# Patient Record
Sex: Female | Born: 1995 | Race: Black or African American | Hispanic: No | Marital: Single | Smoking: Current every day smoker
Health system: Southern US, Community
[De-identification: ages and names within clinical notes are randomized; demographics above are authoritative.]

## PROBLEM LIST (undated history)

## (undated) DIAGNOSIS — D509 Iron deficiency anemia, unspecified: Secondary | ICD-10-CM

## (undated) DIAGNOSIS — A419 Sepsis, unspecified organism: Secondary | ICD-10-CM

## (undated) DIAGNOSIS — N750 Cyst of Bartholin's gland: Secondary | ICD-10-CM

## (undated) DIAGNOSIS — IMO0001 Reserved for inherently not codable concepts without codable children: Secondary | ICD-10-CM

## (undated) DIAGNOSIS — O021 Missed abortion: Secondary | ICD-10-CM

## (undated) DIAGNOSIS — A749 Chlamydial infection, unspecified: Secondary | ICD-10-CM

## (undated) DIAGNOSIS — N921 Excessive and frequent menstruation with irregular cycle: Secondary | ICD-10-CM

## (undated) DIAGNOSIS — O009 Unspecified ectopic pregnancy without intrauterine pregnancy: Secondary | ICD-10-CM

## (undated) DIAGNOSIS — N289 Disorder of kidney and ureter, unspecified: Secondary | ICD-10-CM

## (undated) DIAGNOSIS — N898 Other specified noninflammatory disorders of vagina: Secondary | ICD-10-CM

## (undated) HISTORY — PX: ECTOPIC PREGNANCY SURGERY: SHX613

## (undated) HISTORY — DX: Missed abortion: O02.1

## (undated) HISTORY — PX: WISDOM TOOTH EXTRACTION: SHX21

## (undated) HISTORY — PX: DIAGNOSTIC LAPAROSCOPY: SUR761

## (undated) HISTORY — DX: Sepsis, unspecified organism: A41.9

## (undated) HISTORY — DX: Other specified noninflammatory disorders of vagina: N89.8

## (undated) HISTORY — DX: Chlamydial infection, unspecified: A74.9

## (undated) HISTORY — PX: DILATION AND CURETTAGE, DIAGNOSTIC / THERAPEUTIC: SUR384

## (undated) HISTORY — DX: Cyst of Bartholin's gland: N75.0

---
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
## 2015-02-10 ENCOUNTER — Encounter: Payer: 59 | Attending: Nurse Practitioner | Admitting: Dietician

## 2015-02-10 DIAGNOSIS — E669 Obesity, unspecified: Secondary | ICD-10-CM | POA: Diagnosis not present

## 2015-02-10 NOTE — Progress Notes (Signed)
Medical Nutrition Therapy: Visit start time: 1100  end time: 1200  Assessment:  Diagnosis: obesity Past medical history: no medical issues Psychosocial issues/ stress concerns: possible depression Preferred learning method:  . Auditory  Current weight: 288.6  Height: 5'6" Medications, supplements: none  Progress and evaluation: Patient reports weight loss of 30-40 lbs during high school by eating smaller portions.    She has since regained weight by increasing portions once again, and dealing with more stress.      She has a 42-year-old child, going to school, and working part-time job. Physical activity: none  Dietary Intake:  Usual eating pattern includes 2 meals and 0-1 snacks per day. Dining out frequency: 4 meals per week.  Breakfast: none Snack: none Lunch: 12:30pm usually fast food ie burger, fries Snack: none, no time at school  Supper: most days home burger, fries; pork chop with potatoes, greens, or corn, chicken. 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   Nutrition Care Education: Topics covered: weight management Basic nutrition: basic food groups, appropriate nutrient balance, appropriate meal and snack schedule, general nutrition guidelines    Weight control: behavioral changes for weight loss, controlling food portions, importance of eating at regular intervals; meal plan for 1700kcal daily   Also discussed importance of limiting excess fat and sugar in foods, and choosing high fiber foods and lean proteins   Illustrated examples of balanced meals with food models, menus  Nutritional Diagnosis:  [place]-3.3 Overweight/obesity As related to history of excess caloric intake, low activity level.  As evidenced by patient report, high BMI.  Intervention: Instruction as noted above.   Set goals to decrease meals eaten out, increase structured eating during the day, decrease fat  and sugar intake.   Patient agrees to return for follow-up.      Education Materials given:  . Food lists/ Planning A Balanced Meal . Sample meal pattern/ menus: Quick and Healthy Meal Ideas . Snacking handout . Goals/ instructions  Learner/ who was taught:  . Patient   Level of understanding: . Partial understanding; needs review/ practice  Demonstrated degree of understanding via:   Teach back Learning barriers: . None  Willingness to learn/ readiness for change: . Eager, change in progress  Monitoring and Evaluation:  Dietary intake, exercise, and body weight      follow up: 03/10/15

## 2015-02-10 NOTE — Patient Instructions (Addendum)
   Contact the [facility] pastoral care department at [phone].  Bring some lunches with you to school, use quick menus provided.  Choose low fat foods such as grilled meats, limiting processed meats like hot dogs, and use only small amounts of mayo, butter, or salad dressings.  Limit juices to 1 glass daily or less, try Healthy Balance juice, or flavored waters like [employer] or [employer].  Eat a small meal or snack in the morning sometime.

## 2015-03-10 ENCOUNTER — Ambulatory Visit: Payer: 59 | Admitting: Dietician

## 2015-05-19 ENCOUNTER — Encounter: Payer: Self-pay | Admitting: Dietician

## 2015-05-19 NOTE — Progress Notes (Signed)
Have not heard back from patient to reschedule missed appointment on 03/10/15. Sent discharge letter to MD.

## 2015-06-03 ENCOUNTER — Emergency Department
Admission: EM | Admit: 2015-06-03 | Discharge: 2015-06-03 | Disposition: A | Discharge status: Home / Self Care | Payer: 59 | Attending: Emergency Medicine | Admitting: Emergency Medicine

## 2015-06-03 DIAGNOSIS — R109 Unspecified abdominal pain: Secondary | ICD-10-CM

## 2015-06-03 DIAGNOSIS — N939 Abnormal uterine and vaginal bleeding, unspecified: Secondary | ICD-10-CM | POA: Insufficient documentation

## 2015-06-03 DIAGNOSIS — Z3202 Encounter for pregnancy test, result negative: Secondary | ICD-10-CM | POA: Diagnosis not present

## 2015-06-03 LAB — CBC WITH DIFFERENTIAL/PLATELET
Basophils Absolute: 0 10*3/uL (ref 0–0.1)
Basophils Relative: 0 %
EOS PCT: 1 %
Eosinophils Absolute: 0 10*3/uL (ref 0–0.7)
HEMATOCRIT: 32.4 % — AB (ref 35.0–47.0)
Hemoglobin: 10.3 g/dL — ABNORMAL LOW (ref 12.0–16.0)
LYMPHS ABS: 1.8 10*3/uL (ref 1.0–3.6)
LYMPHS PCT: 24 %
MCH: 26.5 pg (ref 26.0–34.0)
MCHC: 31.9 g/dL — ABNORMAL LOW (ref 32.0–36.0)
MCV: 83 fL (ref 80.0–100.0)
Monocytes Absolute: 0.6 10*3/uL (ref 0.2–0.9)
Monocytes Relative: 8 %
NEUTROS ABS: 4.9 10*3/uL (ref 1.4–6.5)
Neutrophils Relative %: 67 %
PLATELETS: 242 10*3/uL (ref 150–440)
RBC: 3.9 MIL/uL (ref 3.80–5.20)
RDW: 14.5 % (ref 11.5–14.5)
WBC: 7.3 10*3/uL (ref 3.6–11.0)

## 2015-06-03 LAB — WET PREP, GENITAL
CLUE CELLS WET PREP: NONE SEEN
Sperm: NONE SEEN
Trich, Wet Prep: NONE SEEN
Yeast Wet Prep HPF POC: NONE SEEN

## 2015-06-03 LAB — CHLAMYDIA/NGC RT PCR (ARMC ONLY)
Chlamydia Tr: NOT DETECTED
N gonorrhoeae: NOT DETECTED

## 2015-06-03 LAB — BASIC METABOLIC PANEL
Anion gap: 6 (ref 5–15)
BUN: 11 mg/dL (ref 6–20)
CO2: 28 mmol/L (ref 22–32)
Calcium: 9.3 mg/dL (ref 8.9–10.3)
Chloride: 106 mmol/L (ref 101–111)
Creatinine, Ser: 0.64 mg/dL (ref 0.44–1.00)
GFR calc Af Amer: >60 mL/min (ref >60)
Glucose, Bld: 96 mg/dL (ref 65–99)
POTASSIUM: 3.9 mmol/L (ref 3.5–5.1)
Sodium: 140 mmol/L (ref 135–145)

## 2015-06-03 LAB — POCT PREGNANCY, URINE: Preg Test, Ur: NEGATIVE

## 2015-06-03 LAB — URINALYSIS COMPLETE WITH MICROSCOPIC (ARMC ONLY)
BACTERIA UA: NONE SEEN
BILIRUBIN URINE: NEGATIVE
Glucose, UA: NEGATIVE mg/dL
Ketones, ur: NEGATIVE mg/dL
Leukocytes, UA: NEGATIVE
Nitrite: NEGATIVE
PH: 9 — AB (ref 5.0–8.0)
Protein, ur: 30 mg/dL — AB
Specific Gravity, Urine: 1.02 (ref 1.005–1.030)

## 2015-06-03 MED ORDER — NAPROXEN SODIUM 550 MG PO TABS: 550.0000 mg | ORAL_TABLET | Freq: Two times a day (BID) | ORAL | Status: DC

## 2015-06-03 MED ORDER — ONDANSETRON 4 MG PO TBDP: 4.0000 mg | ORAL_TABLET | Freq: Three times a day (TID) | ORAL | Status: DC | PRN

## 2015-06-03 NOTE — Discharge Instructions (Signed)
Abnormal Uterine Bleeding Abnormal uterine bleeding means bleeding from the vagina that is not your normal menstrual period. This can be:  Bleeding or spotting between periods.  Bleeding after sex (sexual intercourse).  Bleeding that is heavier or more than normal.  Periods that last longer than usual.  Bleeding after menopause. There are many problems that may cause this. Treatment will depend on the cause of the bleeding. Any kind of bleeding that is not normal should be reviewed by your doctor.  HOME CARE Watch your condition for any changes. These actions may lessen any discomfort you are having:  Do not use tampons or douches as told by your doctor.  Change your pads often. You should get regular pelvic exams and Pap tests. Keep all appointments for tests as told by your doctor. GET HELP IF:  You are bleeding for more than 1 week.  You feel dizzy at times. GET HELP RIGHT AWAY IF:   You pass out.  You have to change pads every 15 to 30 minutes.  You have belly pain.  You have a fever.  You become sweaty or weak.  You are passing large blood clots from the vagina.  You feel sick to your stomach (nauseous) and throw up (vomit). MAKE SURE YOU:  Understand these instructions.  Will watch your condition.  Will get help right away if you are not doing well or get worse.   This information is not intended to replace advice given to you by your health care provider. Make sure you discuss any questions you have with your health care provider.   Document Released: 03/17/2009 Document Revised: 05/25/2013 Document Reviewed: 12/17/2012 Elsevier Interactive Patient Education 2016 [employer]    Take Anaprox for abdominal cramping. Zofran for and if needed for nausea. Obtain iron supplement at drugstore and take 1 per day. Follow-up with your doctor in medicine if any continued problems.

## 2015-06-03 NOTE — ED Notes (Signed)
Pt here for abdominal cramping and small amount of vaginal bleeding. Reports having longer period than normal.  Just recently had implant removed.

## 2015-06-03 NOTE — ED Notes (Signed)
Discussed discharge instructions, prescriptions, and follow-up care with patient. No questions or concerns at this time. Pt stable at discharge.  

## 2015-06-03 NOTE — ED Provider Notes (Signed)
[facility] Emergency Department Provider Note ____________________________________________  Time seen: Approximately 11:56 AM  I have reviewed the triage vital signs and the nursing notes.   HISTORY  Chief Complaint Abdominal Cramping and Vaginal Bleeding   HPI Andrea Chang is a 19 y.o. female is here with complaint of abdominal cramping and vaginal bleeding. Patient states that she had an implant that was removed approximately 3 months ago. She has had intercourse since the removal of the implant without contraceptive protection. Patient states that she began with her period this month lasting longer than normal. She denies any fever or chills. She states that prior to her bleeding she did have a white discharge. She is unaware of her sexual partner having any problems at this time.   Past Medical History  Diagnosis Date  . Normal cardiac stress test     There are no active problems to display for this patient.   Past Surgical History  Procedure Laterality Date  . Cesarean section      Current Outpatient Rx  Name  Route  Sig  Dispense  Refill  . naproxen sodium (ANAPROX DS) 550 MG tablet   Oral   Take 1 tablet (550 mg total) by mouth 2 (two) times daily with a meal.   14 tablet   0   . ondansetron (ZOFRAN ODT) 4 MG disintegrating tablet   Oral   Take 1 tablet (4 mg total) by mouth every 8 (eight) hours as needed for nausea or vomiting.   20 tablet   0     Allergies Review of patient's allergies indicates no known allergies.  No family history on file.  Social History Social History  Substance Use Topics  . Smoking status: Never Smoker   . Smokeless tobacco: Never Used  . Alcohol Use: No    Review of Systems Constitutional: No fever/chills Eyes: No visual changes. ENT: No sore throat. Cardiovascular: Denies chest pain. Respiratory: Denies shortness of breath. Gastrointestinal: Positive abdominal cramping  No nausea, no  vomiting.  No diarrhea.  No constipation. Genitourinary: Negative for dysuria. Musculoskeletal: Negative for back pain. Skin: Negative for rash. Neurological: Negative for headaches, focal weakness or numbness.  10-point ROS otherwise negative.  ____________________________________________   PHYSICAL EXAM:  VITAL SIGNS: ED Triage Vitals  Enc Vitals Group     BP 06/03/15 1125 105/62 mmHg     Pulse Rate 06/03/15 1125 81     Resp 06/03/15 1125 18     Temp 06/03/15 1125 98.3 F (36.8 C)     Temp Source 06/03/15 1125 Oral     SpO2 06/03/15 1125 100 %     Weight 06/03/15 1125 288 lb (130.636 kg)     Height 06/03/15 1125  (1.676 m)     Head Cir --      Peak Flow --      Pain Score 06/03/15 1126 9     Pain Loc --      Pain Edu? --      Excl. in GC? --     Constitutional: Alert and oriented. Well appearing and in no acute distress. Eyes: Conjunctivae are normal. PERRL. EOMI. Head: Atraumatic. Nose: No congestion/rhinnorhea. Neck: No stridor.   Cardiovascular: Normal rate, regular rhythm. Grossly normal heart sounds.  Good peripheral circulation. Respiratory: Normal respiratory effort.  No retractions. Lungs CTAB. Gastrointestinal: Soft and nontender. No distention. Bowel sounds 4 quadrants within normal limits. Genitourinary: Pelvic exam with minimal blood in the vaginal vault. There  is minimal tenderness on bimanual exam and no cervical motion tenderness. There is no masses and adnexal areas bilaterally. GC and chlamydia culture was obtained as well as wet prep. Musculoskeletal: No lower extremity tenderness nor edema.  No joint effusions. Neurologic:  Normal speech and language. No gross focal neurologic deficits are appreciated. No gait instability. Skin:  Skin is warm, dry and intact. No rash noted. Psychiatric: Mood and affect are normal. Speech and behavior are normal.  ____________________________________________   LABS (all labs ordered are listed, but only  abnormal results are displayed)  Labs Reviewed  WET PREP, GENITAL - Abnormal; Notable for the following:    WBC, Wet Prep HPF POC FEW (*)    All other components within normal limits  CBC WITH DIFFERENTIAL/PLATELET - Abnormal; Notable for the following:    Hemoglobin 10.3 (*)    HCT 32.4 (*)    MCHC 31.9 (*)    All other components within normal limits  URINALYSIS COMPLETEWITH MICROSCOPIC ([facility] ONLY) - Abnormal; Notable for the following:    Color, Urine YELLOW (*)    APPearance CLEAR (*)    Hgb urine dipstick 1+ (*)    pH 9.0 (*)    Protein, ur 30 (*)    Squamous Epithelial / LPF 0-5 (*)    All other components within normal limits  CHLAMYDIA/NGC RT PCR ([facility] ONLY)  BASIC METABOLIC PANEL  POC URINE PREG, ED  POCT PREGNANCY, URINE  POC URINE PREG, ED    PROCEDURES  Procedure(s) performed: None  Critical Care performed: No  ____________________________________________   INITIAL IMPRESSION / ASSESSMENT AND PLAN / ED COURSE  Pertinent labs & imaging results that were available during my care of the patient were reviewed by me and considered in my medical decision making (see chart for details).  Patient's GC and chlamydia culture results was back prior to patient's discharge and patient is aware that they are negative. Patient is to obtain over-the-counter iron tablets daily. She is to follow-up with her gynecologist for further evaluation of her menstrual periods since removing her implant. She is placed on Anaprox DS twice a day for abdominal cramps and Zofran if needed for nausea. ____________________________________________   FINAL CLINICAL IMPRESSION(S) / ED DIAGNOSES  Final diagnoses:  Vaginal bleeding problems  Abdominal cramps      Tommi RumpsRhonda L Fountain Derusha, PA-C 06/03/15 1828  Darien Ramusavid W Kaminski, MD 06/06/15 [phone]
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
## 2015-09-28 ENCOUNTER — Encounter: Payer: Self-pay | Admitting: Emergency Medicine

## 2015-09-28 ENCOUNTER — Emergency Department
Admission: EM | Admit: 2015-09-28 | Discharge: 2015-09-28 | Disposition: A | Discharge status: Home / Self Care | Payer: 59 | Attending: Emergency Medicine | Admitting: Emergency Medicine

## 2015-09-28 ENCOUNTER — Emergency Department: Payer: 59

## 2015-09-28 DIAGNOSIS — Z3A01 Less than 8 weeks gestation of pregnancy: Secondary | ICD-10-CM | POA: Diagnosis not present

## 2015-09-28 DIAGNOSIS — Z3490 Encounter for supervision of normal pregnancy, unspecified, unspecified trimester: Secondary | ICD-10-CM

## 2015-09-28 DIAGNOSIS — R1030 Lower abdominal pain, unspecified: Secondary | ICD-10-CM | POA: Diagnosis present

## 2015-09-28 DIAGNOSIS — O2341 Unspecified infection of urinary tract in pregnancy, first trimester: Secondary | ICD-10-CM | POA: Diagnosis not present

## 2015-09-28 DIAGNOSIS — Z79899 Other long term (current) drug therapy: Secondary | ICD-10-CM | POA: Diagnosis not present

## 2015-09-28 DIAGNOSIS — N390 Urinary tract infection, site not specified: Secondary | ICD-10-CM

## 2015-09-28 HISTORY — DX: Unspecified ectopic pregnancy without intrauterine pregnancy: O00.90

## 2015-09-28 LAB — URINALYSIS COMPLETE WITH MICROSCOPIC (ARMC ONLY)
Bilirubin Urine: NEGATIVE
Glucose, UA: NEGATIVE mg/dL
HGB URINE DIPSTICK: NEGATIVE
Nitrite: POSITIVE — AB
PH: 7 (ref 5.0–8.0)
PROTEIN: NEGATIVE mg/dL
Specific Gravity, Urine: 1.015 (ref 1.005–1.030)

## 2015-09-28 LAB — HCG, QUANTITATIVE, PREGNANCY: hCG, Beta Chain, Quant, S: 73661 m[IU]/mL — ABNORMAL HIGH (ref <5)

## 2015-09-28 LAB — COMPREHENSIVE METABOLIC PANEL
ALT: 11 U/L — ABNORMAL LOW (ref 14–54)
AST: 13 U/L — AB (ref 15–41)
Albumin: 4.1 g/dL (ref 3.5–5.0)
Alkaline Phosphatase: 41 U/L (ref 38–126)
Anion gap: 9 (ref 5–15)
BUN: 11 mg/dL (ref 6–20)
CHLORIDE: 104 mmol/L (ref 101–111)
CO2: 23 mmol/L (ref 22–32)
Calcium: 9.2 mg/dL (ref 8.9–10.3)
Creatinine, Ser: 0.56 mg/dL (ref 0.44–1.00)
GFR calc non Af Amer: >60 mL/min (ref >60)
Glucose, Bld: 89 mg/dL (ref 65–99)
POTASSIUM: 3.4 mmol/L — AB (ref 3.5–5.1)
SODIUM: 136 mmol/L (ref 135–145)
Total Bilirubin: 0.5 mg/dL (ref 0.3–1.2)
Total Protein: 8 g/dL (ref 6.5–8.1)

## 2015-09-28 LAB — CBC
HEMATOCRIT: 34.7 % — AB (ref 35.0–47.0)
Hemoglobin: 10.9 g/dL — ABNORMAL LOW (ref 12.0–16.0)
MCH: 23.6 pg — ABNORMAL LOW (ref 26.0–34.0)
MCHC: 31.3 g/dL — ABNORMAL LOW (ref 32.0–36.0)
MCV: 75.5 fL — AB (ref 80.0–100.0)
Platelets: 264 10*3/uL (ref 150–440)
RBC: 4.6 MIL/uL (ref 3.80–5.20)
RDW: 16.3 % — ABNORMAL HIGH (ref 11.5–14.5)
WBC: 9.7 10*3/uL (ref 3.6–11.0)

## 2015-09-28 LAB — CHLAMYDIA/NGC RT PCR (ARMC ONLY)
Chlamydia Tr: NOT DETECTED
N gonorrhoeae: NOT DETECTED

## 2015-09-28 LAB — POCT PREGNANCY, URINE: Preg Test, Ur: POSITIVE — AB

## 2015-09-28 LAB — WET PREP, GENITAL
Sperm: NONE SEEN
YEAST WET PREP: NONE SEEN

## 2015-09-28 MED ORDER — ONDANSETRON 4 MG PO TBDP
4.0000 mg | ORAL_TABLET | Freq: Once | ORAL | Status: AC
  Administered 2015-09-28: 4 mg via ORAL

## 2015-09-28 MED ORDER — ONDANSETRON 4 MG PO TBDP
ORAL_TABLET | ORAL | Status: AC
  Filled 2015-09-28: qty 1

## 2015-09-28 MED ORDER — NITROFURANTOIN MONOHYD MACRO 100 MG PO CAPS: 100.0000 mg | ORAL_CAPSULE | Freq: Two times a day (BID) | ORAL | Status: AC

## 2015-09-28 NOTE — ED Notes (Signed)
Called in waiting room no answer. 

## 2015-09-28 NOTE — Discharge Instructions (Signed)
Urinary Tract Infection A urinary tract infection (UTI) can occur any place along the urinary tract. The tract includes the kidneys, ureters, bladder, and urethra. A type of germ called bacteria often causes a UTI. UTIs are often helped with antibiotic medicine.  HOME CARE   If given, take antibiotics as told by your doctor. Finish them even if you start to feel better.  Drink enough fluids to keep your pee (urine) clear or pale yellow.  Avoid tea, drinks with caffeine, and bubbly (carbonated) drinks.  Pee often. Avoid holding your pee in for a long time.  Pee before and after having sex (intercourse).  Wipe from front to back after you poop (bowel movement) if you are a woman. Use each tissue only once. GET HELP RIGHT AWAY IF:   You have back pain.  You have lower belly (abdominal) pain.  You have chills.  You feel sick to your stomach (nauseous).  You throw up (vomit).  Your burning or discomfort with peeing does not go away.  You have a fever.  Your symptoms are not better in 3 days. MAKE SURE YOU:   Understand these instructions.  Will watch your condition.  Will get help right away if you are not doing well or get worse.   This information is not intended to replace advice given to you by your health care provider. Make sure you discuss any questions you have with your health care provider.   Document Released: 11/06/2007 Document Revised: 06/10/2014 Document Reviewed: 12/19/2011 Elsevier Interactive Patient Education 2016 Elsevier Inc.  Pregnancy and Urinary Tract Infection A urinary tract infection (UTI) is a bacterial infection of the urinary tract. Infection of the urinary tract can include the ureters, kidneys (pyelonephritis), bladder (cystitis), and urethra (urethritis). All pregnant women should be screened for bacteria in the urinary tract. Identifying and treating a UTI will decrease the risk of preterm labor and developing more serious infections in both  the mother and baby. CAUSES Bacteria germs cause almost all UTIs.  RISK FACTORS Many factors can increase your chances of getting a UTI during pregnancy. These include:  Having a short urethra.  Poor toilet and hygiene habits.  Sexual intercourse.  Blockage of urine along the urinary tract.  Problems with the pelvic muscles or nerves.  Diabetes.  Obesity.  Bladder problems after having several children.  Previous history of UTI. SIGNS AND SYMPTOMS   Pain, burning, or a stinging feeling when urinating.  Suddenly feeling the need to urinate right away (urgency).  Loss of bladder control (urinary incontinence).  Frequent urination, more than is common with pregnancy.  Lower abdominal or back discomfort.  Cloudy urine.  Blood in the urine (hematuria).  Fever. When the kidneys are infected, the symptoms may be:  Back pain.  Flank pain on the right side more so than the left.  Fever.  Chills.  Nausea.  Vomiting. DIAGNOSIS  A urinary tract infection is usually diagnosed through urine tests. Additional tests and procedures are sometimes done. These may include:  Ultrasound exam of the kidneys, ureters, bladder, and urethra.  Looking in the bladder with a lighted tube (cystoscopy). TREATMENT Typically, UTIs can be treated with antibiotic medicines.  HOME CARE INSTRUCTIONS   Only take over-the-counter or prescription medicines as directed by your health care provider. If you were prescribed antibiotics, take them as directed. Finish them even if you start to feel better.  Drink enough fluids to keep your urine clear or pale yellow.  Do not have sexual  intercourse until the infection is gone and your health care provider says it is okay.  Make sure you are tested for UTIs throughout your pregnancy. These infections often come back. Preventing a UTI in the Future  Practice good toilet habits. Always wipe from front to back. Use the tissue only once.  Do  not hold your urine. Empty your bladder as soon as possible when the urge comes.  Do not douche or use deodorant sprays.  Wash with soap and warm water around the genital area and the anus.  Empty your bladder before and after sexual intercourse.  Wear underwear with a cotton crotch.  Avoid caffeine and carbonated drinks. They can irritate the bladder.  Drink cranberry juice or take cranberry pills. This may decrease the risk of getting a UTI.  Do not drink alcohol.  Keep all your appointments and tests as scheduled. SEEK MEDICAL CARE IF:   Your symptoms get worse.  You are still having fevers 2 or more days after treatment begins.  You have a rash.  You feel that you are having problems with medicines prescribed.  You have abnormal vaginal discharge. SEEK IMMEDIATE MEDICAL CARE IF:   You have back or flank pain.  You have chills.  You have blood in your urine.  You have nausea and vomiting.  You have contractions of your uterus.  You have a gush of fluid from the vagina. MAKE SURE YOU:  Understand these instructions.   Will watch your condition.   Will get help right away if you are not doing well or get worse.    This information is not intended to replace advice given to you by your health care provider. Make sure you discuss any questions you have with your health care provider.   Document Released: 09/14/2010 Document Revised: 03/10/2013 Document Reviewed: 12/17/2012 Elsevier Interactive Patient Education 2016 [employer].  Prenatal Care WHAT IS PRENATAL CARE?  Prenatal care is the process of caring for a pregnant woman before she gives birth. Prenatal care makes sure that she and her baby remain as healthy as possible throughout pregnancy. Prenatal care may be provided by a midwife, family practice health care provider, or a childbirth and pregnancy specialist (obstetrician). Prenatal care may include physical examinations, testing, treatments, and  education on nutrition, lifestyle, and social support services. WHY IS PRENATAL CARE SO IMPORTANT?  Early and consistent prenatal care increases the chance that you and your baby will remain healthy throughout your pregnancy. This type of care also decreases a baby's risk of being born too early (prematurely), or being born smaller than expected (small for gestational age). Any underlying medical conditions you may have that could pose a risk during your pregnancy are discussed during prenatal care visits. You will also be monitored regularly for any new conditions that may arise during your pregnancy so they can be treated quickly and effectively. WHAT HAPPENS DURING PRENATAL CARE VISITS? Prenatal care visits may include the following: Discussion Tell your health care provider about any new signs or symptoms you have experienced since your last visit. These might include:  Nausea or vomiting.  Increased or decreased level of energy.  Difficulty sleeping.  Back or leg pain.  Weight changes.  Frequent urination.  Shortness of breath with physical activity.  Changes in your skin, such as the development of a rash or itchiness.  Vaginal discharge or bleeding.  Feelings of excitement or nervousness.  Changes in your baby's movements. You may want to write down any  questions or topics you want to discuss with your health care provider and bring them with you to your appointment. Examination During your first prenatal care visit, you will likely have a complete physical exam. Your health care provider will often examine your vagina, cervix, and the position of your uterus, as well as check your heart, lungs, and other body systems. As your pregnancy progresses, your health care provider will measure the size of your uterus and your baby's position inside your uterus. He or she may also examine you for early signs of labor. Your prenatal visits may also include checking your blood pressure  and, after about 10-12 weeks of pregnancy, listening to your baby's heartbeat. Testing Regular testing often includes:  Urinalysis. This checks your urine for glucose, protein, or signs of infection.  Blood count. This checks the levels of white and red blood cells in your body.  Tests for sexually transmitted infections (STIs). Testing for STIs at the beginning of pregnancy is routinely done and is required in many states.  Antibody testing. You will be checked to see if you are immune to certain illnesses, such as rubella, that can affect a developing fetus.  Glucose screen. Around 24-28 weeks of pregnancy, your blood glucose level will be checked for signs of gestational diabetes. Follow-up tests may be recommended.  Group B strep. This is a bacteria that is commonly found inside a woman's vagina. This test will inform your health care provider if you need an antibiotic to reduce the amount of this bacteria in your body prior to labor and childbirth.  Ultrasound. Many pregnant women undergo an ultrasound screening around 18-20 weeks of pregnancy to evaluate the health of the fetus and check for any developmental abnormalities.  HIV (human immunodeficiency virus) testing. Early in your pregnancy, you will be screened for HIV. If you are at high risk for HIV, this test may be repeated during your third trimester of pregnancy. You may be offered other testing based on your age, personal or family medical history, or other factors.  HOW OFTEN SHOULD I PLAN TO SEE MY HEALTH CARE PROVIDER FOR PRENATAL CARE? Your prenatal care check-up schedule depends on any medical conditions you have before, or develop during, your pregnancy. If you do not have any underlying medical conditions, you will likely be seen for checkups:  Monthly, during the first 6 months of pregnancy.  Twice a month during months 7 and 8 of pregnancy.  Weekly starting in the 9th month of pregnancy and until delivery. If you  develop signs of early labor or other concerning signs or symptoms, you may need to see your health care provider more often. Ask your health care provider what prenatal care schedule is best for you. WHAT CAN I DO TO KEEP MYSELF AND MY BABY AS HEALTHY AS POSSIBLE DURING MY PREGNANCY?  Take a prenatal vitamin containing 400 micrograms (0.4 mg) of folic acid every day. Your health care provider may also ask you to take additional vitamins such as iodine, vitamin D, iron, copper, and zinc.  Take 1500-2000 mg of calcium daily starting at your 20th week of pregnancy until you deliver your baby.  Make sure you are up to date on your vaccinations. Unless directed otherwise by your health care provider:  You should receive a tetanus, diphtheria, and pertussis (Tdap) vaccination between the 27th and 36th week of your pregnancy, regardless of when your last Tdap immunization occurred. This helps protect your baby from whooping cough (pertussis) after he  or she is born.  You should receive an annual inactivated influenza vaccine (IIV) to help protect you and your baby from influenza. This can be done at any point during your pregnancy.  Eat a well-rounded diet that includes:  Fresh fruits and vegetables.  Lean proteins.  Calcium-rich foods such as milk, yogurt, hard cheeses, and dark, leafy greens.  Whole grain breads.  Do noteat seafood high in mercury, including:  Swordfish.  Tilefish.  Shark.  King mackerel.  More than 6 oz tuna per week.  Do not eat:  Raw or undercooked meats or eggs.  Unpasteurized foods, such as soft cheeses (brie, blue, or feta), juices, and milks.  Lunch meats.  Hot dogs that have not been heated until they are steaming.  Drink enough water to keep your urine clear or pale yellow. For many women, this may be 10 or more 8 oz glasses of water each day. Keeping yourself hydrated helps deliver nutrients to your baby and may prevent the start of pre-term  uterine contractions.  Do not use any tobacco products including cigarettes, chewing tobacco, or electronic cigarettes. If you need help quitting, ask your health care provider.  Do not drink beverages containing alcohol. No safe level of alcohol consumption during pregnancy has been determined.  Do not use any illegal drugs. These can harm your developing baby or cause a miscarriage.  Ask your health care provider or pharmacist before taking any prescription or over-the-counter medicines, herbs, or supplements.  Limit your caffeine intake to no more than 200 mg per day.  Exercise. Unless told otherwise by your health care provider, try to get 30 minutes of moderate exercise most days of the week. Do not  do high-impact activities, contact sports, or activities with a high risk of falling, such as horseback riding or downhill skiing.  Get plenty of rest.  Avoid anything that raises your body temperature, such as hot tubs and saunas.  If you own a cat, do not empty its litter box. Bacteria contained in cat feces can cause an infection called toxoplasmosis. This can result in serious harm to the fetus.  Stay away from chemicals such as insecticides, lead, mercury, and cleaning or paint products that contain solvents.  Do not have any X-rays taken unless medically necessary.  Take a childbirth and breastfeeding preparation class. Ask your health care provider if you need a referral or recommendation.   This information is not intended to replace advice given to you by your health care provider. Make sure you discuss any questions you have with your health care provider.   Document Released: 05/23/2003 Document Revised: 06/10/2014 Document Reviewed: 08/04/2013 Elsevier Interactive Patient Education [employer] Elsevier Inc.

## 2015-09-28 NOTE — ED Notes (Signed)
Patient presents to the ED with pelvic cramping.  Patient states, "it feels like it normally does before my period but I don't usually have cramps for this long and my period hasn't started."  Patient states she may be pregnant but has not taken a home pregnancy test.  Patient reports nausea and irritability.

## 2015-09-28 NOTE — ED Provider Notes (Signed)
[facility] Emergency Department Provider Note  Time seen: 2:45 PM  I have reviewed the triage vital signs and the nursing notes.   HISTORY  Chief Complaint Pelvic Pain    HPI Drue Flirtameka D Timson is a 20 y.o. female with no past medical history who presents the emergency department for lower abdominal pain, and being late on her period. According to the patient for the past 2 weeks or so she has been having some lower abdominal discomfort and cramping. It felt like her period was about to start but he never started. Patient states she is also been having a mild amount of vaginal discharge. Denies any dysuria or hematuria. Denies any nausea, vomiting. Patient does report increased irritability and issues with her mood. She thought maybe she could be pregnant but has not taken appearing to test at home. Describes her lower abdominal discomfort is mild, cramping sensation with a mild amount of white vaginal discharge.     Past Medical History  Diagnosis Date  . Normal cardiac stress test   . Ectopic pregnancy     There are no active problems to display for this patient.   Past Surgical History  Procedure Laterality Date  . Cesarean section    . Ectopic pregnancy surgery      Current Outpatient Rx  Name  Route  Sig  Dispense  Refill  . naproxen sodium (ANAPROX DS) 550 MG tablet   Oral   Take 1 tablet (550 mg total) by mouth 2 (two) times daily with a meal.   14 tablet   0   . ondansetron (ZOFRAN ODT) 4 MG disintegrating tablet   Oral   Take 1 tablet (4 mg total) by mouth every 8 (eight) hours as needed for nausea or vomiting.   20 tablet   0     Allergies Review of patient's allergies indicates no known allergies.  No family history on file.  Social History Social History  Substance Use Topics  . Smoking status: Never Smoker   . Smokeless tobacco: Never Used  . Alcohol Use: No    Review of Systems Constitutional: Negative for  fever. Cardiovascular: Negative for chest pain. Respiratory: Negative for shortness of breath. Gastrointestinal: Mild lower abdominal pain. Negative for nausea, vomiting, diarrhea Genitourinary: Negative for dysuria. Positive for vaginal discharge. Neurological: Negative for headache 10-point ROS otherwise negative.  ____________________________________________   PHYSICAL EXAM:  VITAL SIGNS: ED Triage Vitals  Enc Vitals Group     BP 09/28/15 1143 119/70 mmHg     Pulse Rate 09/28/15 1140 75     Resp 09/28/15 1140 16     Temp 09/28/15 1140 99.1 F (37.3 C)     Temp Source 09/28/15 1140 Oral     SpO2 09/28/15 1140 99 %     Weight 09/28/15 1140 277 lb (125.646 kg)     Height 09/28/15 1140 5\' 6"  (1.676 m)     Head Cir --      Peak Flow --      Pain Score 09/28/15 1141 7     Pain Loc --      Pain Edu? --      Excl. in GC? --     Constitutional: Alert and oriented. Well appearing and in no distress. Eyes: Normal exam ENT   Head: Normocephalic and atraumatic.   Mouth/Throat: Mucous membranes are moist. Cardiovascular: Normal rate, regular rhythm. No murmur Respiratory: Normal respiratory effort without tachypnea nor retractions. Breath sounds are clear Gastrointestinal:  Soft, mild suprapubic tenderness palpation. No rebound or guarding. No distention. No CVA tenderness Musculoskeletal: Nontender with normal range of motion in all extremities.  Neurologic:  Normal speech and language. No gross focal neurologic deficits Skin:  Skin is warm, dry and intact.  Psychiatric: Mood and affect are normal.   ____________________________________________     RADIOLOGY  Ultrasound consistent with 6 week 3 day IUP.  ____________________________________________    INITIAL IMPRESSION / ASSESSMENT AND PLAN / ED COURSE  Pertinent labs & imaging results that were available during my care of the patient were reviewed by me and considered in my medical decision making (see chart  for details).  Patient presents the emergency department lower abdominal cramping and mild vaginal discharge. Patient's workup shows a urinary tract infection, she is also pregnant with an ultrasound showing 6 week 3 day IUP without abnormality. Patient mild suprapubic tenderness to palpation. Given her vaginal discharge will perform a pelvic examination. We will place the patient on Macrobid for urinary tract infection.  Pelvic exam shows moderate amount of white/clear discharge.  I sent a wet prep as well as GC/Chlamydia. Patient states she needs to go pick up her child and cannot wait for the results. We'll discharge the patient at this time. She states she'll follow up with OB/GYN in the next 2 weeks.  _________________________________________   FINAL CLINICAL IMPRESSION(S) / ED DIAGNOSES  Pelvic cramping First trimester pregnancy UTI   Minna Antis, MD 09/28/15 1515

## 2015-10-03 ENCOUNTER — Emergency Department
Admission: EM | Admit: 2015-10-03 | Discharge: 2015-10-03 | Disposition: A | Discharge status: Home / Self Care | Payer: 59 | Attending: Emergency Medicine | Admitting: Emergency Medicine

## 2015-10-03 ENCOUNTER — Encounter: Payer: Self-pay | Admitting: *Deleted

## 2015-10-03 ENCOUNTER — Emergency Department: Payer: 59

## 2015-10-03 DIAGNOSIS — Y939 Activity, unspecified: Secondary | ICD-10-CM | POA: Diagnosis not present

## 2015-10-03 DIAGNOSIS — S00531A Contusion of lip, initial encounter: Secondary | ICD-10-CM | POA: Diagnosis not present

## 2015-10-03 DIAGNOSIS — A599 Trichomoniasis, unspecified: Secondary | ICD-10-CM | POA: Diagnosis not present

## 2015-10-03 DIAGNOSIS — R6884 Jaw pain: Secondary | ICD-10-CM | POA: Diagnosis not present

## 2015-10-03 DIAGNOSIS — Z3A01 Less than 8 weeks gestation of pregnancy: Secondary | ICD-10-CM | POA: Diagnosis not present

## 2015-10-03 DIAGNOSIS — R1084 Generalized abdominal pain: Secondary | ICD-10-CM | POA: Diagnosis not present

## 2015-10-03 DIAGNOSIS — R109 Unspecified abdominal pain: Secondary | ICD-10-CM

## 2015-10-03 DIAGNOSIS — Y999 Unspecified external cause status: Secondary | ICD-10-CM | POA: Diagnosis not present

## 2015-10-03 DIAGNOSIS — Y929 Unspecified place or not applicable: Secondary | ICD-10-CM | POA: Diagnosis not present

## 2015-10-03 DIAGNOSIS — Z791 Long term (current) use of non-steroidal anti-inflammatories (NSAID): Secondary | ICD-10-CM | POA: Diagnosis not present

## 2015-10-03 DIAGNOSIS — N760 Acute vaginitis: Secondary | ICD-10-CM

## 2015-10-03 DIAGNOSIS — O23591 Infection of other part of genital tract in pregnancy, first trimester: Secondary | ICD-10-CM | POA: Insufficient documentation

## 2015-10-03 DIAGNOSIS — R102 Pelvic and perineal pain: Secondary | ICD-10-CM

## 2015-10-03 DIAGNOSIS — B9689 Other specified bacterial agents as the cause of diseases classified elsewhere: Secondary | ICD-10-CM

## 2015-10-03 DIAGNOSIS — S0993XA Unspecified injury of face, initial encounter: Secondary | ICD-10-CM | POA: Diagnosis present

## 2015-10-03 DIAGNOSIS — O26899 Other specified pregnancy related conditions, unspecified trimester: Secondary | ICD-10-CM

## 2015-10-03 DIAGNOSIS — Z79899 Other long term (current) drug therapy: Secondary | ICD-10-CM | POA: Insufficient documentation

## 2015-10-03 MED ORDER — METRONIDAZOLE 500 MG PO TABS: 500.0000 mg | ORAL_TABLET | Freq: Two times a day (BID) | ORAL | Status: DC

## 2015-10-03 NOTE — ED Notes (Signed)
Pt verbalized she does not want to wait any longer macular degeneration made aware. Pt verbalized she wants prescriptions and wants to leave against medical advice.
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
## 2015-10-03 NOTE — Discharge Instructions (Signed)
As we discussed, although we have a low suspicion that you sustained a fracture to your jaw or an injury to your fetus, we cannot be sure without obtaining imaging.  However, you explained that you are tired of waiting, so we will discharge you with your necessary prescriptions.  We recommend that you read through the included information, take the full course of medication as prescribed, and follow up with your regular doctor.  Return to the emergency department if you develop new or worsening symptoms that concern you.   Trichomoniasis Trichomoniasis is an infection caused by an organism called Trichomonas. The infection can affect both women and men. In women, the outer female genitalia and the vagina are affected. In men, the penis is mainly affected, but the prostate and other reproductive organs can also be involved. Trichomoniasis is a sexually transmitted infection (STI) and is most often passed to another person through sexual contact.  RISK FACTORS  Having unprotected sexual intercourse.  Having sexual intercourse with an infected partner. SIGNS AND SYMPTOMS  Symptoms of trichomoniasis in women include:  Abnormal gray-green frothy vaginal discharge.  Itching and irritation of the vagina.  Itching and irritation of the area outside the vagina. Symptoms of trichomoniasis in men include:   Penile discharge with or without pain.  Pain during urination. This results from inflammation of the urethra. DIAGNOSIS  Trichomoniasis may be found during a Pap test or physical exam. Your health care provider may use one of the following methods to help diagnose this infection:  Testing the pH of the vagina with a test tape.  Using a vaginal swab test that checks for the Trichomonas organism. A test is available that provides results within a few minutes.  Examining a urine sample.  Testing vaginal secretions. Your health care provider may test you for other STIs, including HIV. TREATMENT    You may be given medicine to fight the infection. Women should inform their health care provider if they could be or are pregnant. Some medicines used to treat the infection should not be taken during pregnancy.  Your health care provider may recommend over-the-counter medicines or creams to decrease itching or irritation.  Your sexual partner will need to be treated if infected.  Your health care provider may test you for infection again 3 months after treatment. HOME CARE INSTRUCTIONS   Take medicines only as directed by your health care provider.  Take over-the-counter medicine for itching or irritation as directed by your health care provider.  Do not have sexual intercourse while you have the infection.  Women should not douche or wear tampons while they have the infection.  Discuss your infection with your partner. Your partner may have gotten the infection from you, or you may have gotten it from your partner.  Have your sex partner get examined and treated if necessary.  Practice safe, informed, and protected sex.  See your health care provider for other STI testing. SEEK MEDICAL CARE IF:   You still have symptoms after you finish your medicine.  You develop abdominal pain.  You have pain when you urinate.  You have bleeding after sexual intercourse.  You develop a rash.  Your medicine makes you sick or makes you throw up (vomit). MAKE SURE YOU:  Understand these instructions.  Will watch your condition.  Will get help right away if you are not doing well or get worse.   This information is not intended to replace advice given to you by your health care  provider. Make sure you discuss any questions you have with your health care provider.   Document Released: 11/13/2000 Document Revised: 06/10/2014 Document Reviewed: 03/01/2013 Elsevier Interactive Patient Education 2016 [employer]  Vaginitis Vaginitis is an inflammation of the vagina. It is most  often caused by a change in the normal balance of the bacteria and yeast that live in the vagina. This change in balance causes an overgrowth of certain bacteria or yeast, which causes the inflammation. There are different types of vaginitis, but the most common types are:  Bacterial vaginosis.  Yeast infection (candidiasis).  Trichomoniasis vaginitis. This is a sexually transmitted infection (STI).  Viral vaginitis.  Atrophic vaginitis.  Allergic vaginitis. CAUSES  The cause depends on the type of vaginitis. Vaginitis can be caused by:  Bacteria (bacterial vaginosis).  Yeast (yeast infection).  A parasite (trichomoniasis vaginitis)  A virus (viral vaginitis).  Low hormone levels (atrophic vaginitis). Low hormone levels can occur during pregnancy, breastfeeding, or after menopause.  Irritants, such as bubble baths, scented tampons, and feminine sprays (allergic vaginitis). Other factors can change the normal balance of the yeast and bacteria that live in the vagina. These include:  Antibiotic medicines.  Poor hygiene.  Diaphragms, vaginal sponges, spermicides, birth control pills, and intrauterine devices (IUD).  Sexual intercourse.  Infection.  Uncontrolled diabetes.  A weakened immune system. SYMPTOMS  Symptoms can vary depending on the cause of the vaginitis. Common symptoms include:  Abnormal vaginal discharge.  The discharge is white, gray, or yellow with bacterial vaginosis.  The discharge is thick, white, and cheesy with a yeast infection.  The discharge is frothy and yellow or greenish with trichomoniasis.  A bad vaginal odor.  The odor is fishy with bacterial vaginosis.  Vaginal itching, pain, or swelling.  Painful intercourse.  Pain or burning when urinating. Sometimes, there are no symptoms. TREATMENT  Treatment will vary depending on the type of infection.   Bacterial vaginosis and trichomoniasis are often treated with antibiotic creams or  pills.  Yeast infections are often treated with antifungal medicines, such as vaginal creams or suppositories.  Viral vaginitis has no cure, but symptoms can be treated with medicines that relieve discomfort. Your sexual partner should be treated as well.  Atrophic vaginitis may be treated with an estrogen cream, pill, suppository, or vaginal ring. If vaginal dryness occurs, lubricants and moisturizing creams may help. You may be told to avoid scented soaps, sprays, or douches.  Allergic vaginitis treatment involves quitting the use of the product that is causing the problem. Vaginal creams can be used to treat the symptoms. HOME CARE INSTRUCTIONS   Take all medicines as directed by your caregiver.  Keep your genital area clean and dry. Avoid soap and only rinse the area with water.  Avoid douching. It can remove the healthy bacteria in the vagina.  Do not use tampons or have sexual intercourse until your vaginitis has been treated. Use sanitary pads while you have vaginitis.  Wipe from front to back. This avoids the spread of bacteria from the rectum to the vagina.  Let air reach your genital area.  Wear cotton underwear to decrease moisture buildup.  Avoid wearing underwear while you sleep until your vaginitis is gone.  Avoid tight pants and underwear or nylons without a cotton panel.  Take off wet clothing (especially bathing suits) as soon as possible.  Use mild, non-scented products. Avoid using irritants, such as:  Scented feminine sprays.  Fabric softeners.  Scented detergents.  Scented tampons.  Scented soaps or bubble baths.  Practice safe sex and use condoms. Condoms may prevent the spread of trichomoniasis and viral vaginitis. SEEK MEDICAL CARE IF:   You have abdominal pain.  You have a fever or persistent symptoms for more than 2-3 days.  You have a fever and your symptoms suddenly get worse.   This information is not intended to replace advice given to  you by your health care provider. Make sure you discuss any questions you have with your health care provider.   Document Released: 03/17/2007 Document Revised: 10/04/2014 Document Reviewed: 10/31/2011 Elsevier Interactive Patient Education 2016 [employer].  Sexually Transmitted Disease A sexually transmitted disease (STD) is a disease or infection that may be passed (transmitted) from person to person, usually during sexual activity. This may happen by way of saliva, semen, blood, vaginal mucus, or urine. Common STDs include:  Gonorrhea.  Chlamydia.  Syphilis.  HIV and AIDS.  Genital herpes.  Hepatitis B and C.  Trichomonas.  Human papillomavirus (HPV).  Pubic lice.  Scabies.  Mites.  Bacterial vaginosis. WHAT ARE CAUSES OF STDs? An STD may be caused by bacteria, a virus, or parasites. STDs are often transmitted during sexual activity if one person is infected. However, they may also be transmitted through nonsexual means. STDs may be transmitted after:   Sexual intercourse with an infected person.  Sharing sex toys with an infected person.  Sharing needles with an infected person or using unclean piercing or tattoo needles.  Having intimate contact with the genitals, mouth, or rectal areas of an infected person.  Exposure to infected fluids during birth. WHAT ARE THE SIGNS AND SYMPTOMS OF STDs? Different STDs have different symptoms. Some people may not have any symptoms. If symptoms are present, they may include:  Painful or bloody urination.  Pain in the pelvis, abdomen, vagina, anus, throat, or eyes.  A skin rash, itching, or irritation.  Growths, ulcerations, blisters, or sores in the genital and anal areas.  Abnormal vaginal discharge with or without bad odor.  Penile discharge in men.  Fever.  Pain or bleeding during sexual intercourse.  Swollen glands in the groin area.  Yellow skin and eyes (jaundice). This is seen with  hepatitis.  Swollen testicles.  Infertility.  Sores and blisters in the mouth. HOW ARE STDs DIAGNOSED? To make a diagnosis, your health care provider may:  Take a medical history.  Perform a physical exam.  Take a sample of any discharge to examine.  Swab the throat, cervix, opening to the penis, rectum, or vagina for testing.  Test a sample of your first morning urine.  Perform blood tests.  Perform a Pap test, if this applies.  Perform a colposcopy.  Perform a laparoscopy. HOW ARE STDs TREATED? Treatment depends on the STD. Some STDs may be treated but not cured.  Chlamydia, gonorrhea, trichomonas, and syphilis can be cured with antibiotic medicine.  Genital herpes, hepatitis, and HIV can be treated, but not cured, with prescribed medicines. The medicines lessen symptoms.  Genital warts from HPV can be treated with medicine or by freezing, burning (electrocautery), or surgery. Warts may come back.  HPV cannot be cured with medicine or surgery. However, abnormal areas may be removed from the cervix, vagina, or vulva.  If your diagnosis is confirmed, your recent sexual partners need treatment. This is true even if they are symptom-free or have a negative culture or evaluation. They should not have sex until their health care providers say it is okay.  Your health care provider may test you for infection again 3 months after treatment. HOW CAN I REDUCE MY RISK OF GETTING AN STD? Take these steps to reduce your risk of getting an STD:  Use latex condoms, dental dams, and water-soluble lubricants during sexual activity. Do not use petroleum jelly or oils.  Avoid having multiple sex partners.  Do not have sex with someone who has other sex partners  Do not have sex with anyone you do not know or who is at high risk for an STD.  Avoid risky sex practices that can break your skin.  Do not have sex if you have open sores on your mouth or skin.  Avoid drinking too much  alcohol or taking illegal drugs. Alcohol and drugs can affect your judgment and put you in a vulnerable position.  Avoid engaging in oral and anal sex acts.  Get vaccinated for HPV and hepatitis. If you have not received these vaccines in the past, talk to your health care provider about whether one or both might be right for you.  If you are at risk of being infected with HIV, it is recommended that you take a prescription medicine daily to prevent HIV infection. This is called pre-exposure prophylaxis (PrEP). You are considered at risk if:  You are a man who has sex with other men (MSM).  You are a heterosexual man or woman and are sexually active with more than one partner.  You take drugs by injection.  You are sexually active with a partner who has HIV.  Talk with your health care provider about whether you are at high risk of being infected with HIV. If you choose to begin PrEP, you should first be tested for HIV. You should then be tested every 3 months for as long as you are taking PrEP. WHAT SHOULD I DO IF I THINK I HAVE AN STD?  See your health care provider.  Tell your sexual partner(s). They should be tested and treated for any STDs.  Do not have sex until your health care provider says it is okay. WHEN SHOULD I GET IMMEDIATE MEDICAL CARE? Contact your health care provider right away if:   You have severe abdominal pain.  You are a man and notice swelling or pain in your testicles.  You are a woman and notice swelling or pain in your vagina.   This information is not intended to replace advice given to you by your health care provider. Make sure you discuss any questions you have with your health care provider.   Document Released: 08/10/2002 Document Revised: 06/10/2014 Document Reviewed: 12/08/2012 Elsevier Interactive Patient Education [employer]

## 2015-11-25 ENCOUNTER — Encounter: Payer: Self-pay | Admitting: Emergency Medicine

## 2015-11-25 ENCOUNTER — Emergency Department: Payer: 59

## 2015-11-25 ENCOUNTER — Inpatient Hospital Stay
Admission: EM | Admit: 2015-11-25 | Discharge: 2015-11-26 | DRG: 690 | Disposition: A | Discharge status: Home / Self Care | Payer: 59 | Attending: Internal Medicine | Admitting: Internal Medicine

## 2015-11-25 DIAGNOSIS — Z833 Family history of diabetes mellitus: Secondary | ICD-10-CM

## 2015-11-25 DIAGNOSIS — A419 Sepsis, unspecified organism: Secondary | ICD-10-CM | POA: Diagnosis present

## 2015-11-25 DIAGNOSIS — Z79899 Other long term (current) drug therapy: Secondary | ICD-10-CM | POA: Diagnosis not present

## 2015-11-25 DIAGNOSIS — Z87440 Personal history of urinary (tract) infections: Secondary | ICD-10-CM

## 2015-11-25 DIAGNOSIS — R109 Unspecified abdominal pain: Secondary | ICD-10-CM

## 2015-11-25 DIAGNOSIS — N12 Tubulo-interstitial nephritis, not specified as acute or chronic: Principal | ICD-10-CM | POA: Diagnosis present

## 2015-11-25 DIAGNOSIS — Z9889 Other specified postprocedural states: Secondary | ICD-10-CM

## 2015-11-25 DIAGNOSIS — E876 Hypokalemia: Secondary | ICD-10-CM | POA: Diagnosis present

## 2015-11-25 LAB — URINALYSIS COMPLETE WITH MICROSCOPIC (ARMC ONLY)
BILIRUBIN URINE: NEGATIVE
Glucose, UA: NEGATIVE mg/dL
Nitrite: POSITIVE — AB
PH: 6 (ref 5.0–8.0)
PROTEIN: 30 mg/dL — AB
Specific Gravity, Urine: 1.014 (ref 1.005–1.030)

## 2015-11-25 LAB — HCG, QUANTITATIVE, PREGNANCY
HCG, BETA CHAIN, QUANT, S: 5 m[IU]/mL — AB (ref <5)
HCG, BETA CHAIN, QUANT, S: 4 m[IU]/mL (ref <5)

## 2015-11-25 LAB — CBC WITH DIFFERENTIAL/PLATELET
Basophils Absolute: 0.1 10*3/uL (ref 0–0.1)
Basophils Relative: 1 %
EOS ABS: 0 10*3/uL (ref 0–0.7)
Eosinophils Relative: 0 %
HCT: 32.9 % — ABNORMAL LOW (ref 35.0–47.0)
HEMOGLOBIN: 10.7 g/dL — AB (ref 12.0–16.0)
Lymphs Abs: 1.8 10*3/uL (ref 1.0–3.6)
MCH: 24.9 pg — AB (ref 26.0–34.0)
MCHC: 32.6 g/dL (ref 32.0–36.0)
MCV: 76.4 fL — AB (ref 80.0–100.0)
Monocytes Absolute: 1.5 10*3/uL — ABNORMAL HIGH (ref 0.2–0.9)
Neutro Abs: 11.9 10*3/uL — ABNORMAL HIGH (ref 1.4–6.5)
Platelets: 232 10*3/uL (ref 150–440)
RBC: 4.31 MIL/uL (ref 3.80–5.20)
RDW: 17.6 % — ABNORMAL HIGH (ref 11.5–14.5)
WBC: 15.3 10*3/uL — AB (ref 3.6–11.0)

## 2015-11-25 LAB — COMPREHENSIVE METABOLIC PANEL
ALBUMIN: 4 g/dL (ref 3.5–5.0)
ALT: 12 U/L — AB (ref 14–54)
AST: 16 U/L (ref 15–41)
Alkaline Phosphatase: 45 U/L (ref 38–126)
Anion gap: 11 (ref 5–15)
BILIRUBIN TOTAL: 0.3 mg/dL (ref 0.3–1.2)
BUN: 8 mg/dL (ref 6–20)
CHLORIDE: 105 mmol/L (ref 101–111)
CO2: 22 mmol/L (ref 22–32)
CREATININE: 0.78 mg/dL (ref 0.44–1.00)
Calcium: 9 mg/dL (ref 8.9–10.3)
GFR calc Af Amer: >60 mL/min (ref >60)
GLUCOSE: 92 mg/dL (ref 65–99)
POTASSIUM: 3.1 mmol/L — AB (ref 3.5–5.1)
Sodium: 138 mmol/L (ref 135–145)
TOTAL PROTEIN: 7.7 g/dL (ref 6.5–8.1)

## 2015-11-25 LAB — LACTIC ACID, PLASMA
Lactic Acid, Venous: 0.9 mmol/L (ref 0.5–2.0)
Lactic Acid, Venous: 0.7 mmol/L (ref 0.5–2.0)

## 2015-11-25 LAB — PREGNANCY, URINE: Preg Test, Ur: NEGATIVE

## 2015-11-25 MED ORDER — SODIUM CHLORIDE 0.9 % IV BOLUS (SEPSIS)
1000.0000 mL | Freq: Once | INTRAVENOUS | Status: AC
  Administered 2015-11-25: 1000 mL via INTRAVENOUS

## 2015-11-25 MED ORDER — ACETAMINOPHEN 325 MG PO TABS
650.0000 mg | ORAL_TABLET | Freq: Four times a day (QID) | ORAL | Status: DC | PRN
  Administered 2015-11-25 – 2015-11-26 (×3): 650 mg via ORAL
  Filled 2015-11-25 (×3): qty 2

## 2015-11-25 MED ORDER — MORPHINE SULFATE (PF) 4 MG/ML IV SOLN
4.0000 mg | Freq: Once | INTRAVENOUS | Status: AC
  Administered 2015-11-25: 4 mg via INTRAVENOUS
  Filled 2015-11-25: qty 1

## 2015-11-25 MED ORDER — ACETAMINOPHEN 650 MG RE SUPP: 650.0000 mg | Freq: Four times a day (QID) | RECTAL | Status: DC | PRN

## 2015-11-25 MED ORDER — ONDANSETRON HCL 4 MG PO TABS: 4.0000 mg | ORAL_TABLET | Freq: Four times a day (QID) | ORAL | Status: DC | PRN

## 2015-11-25 MED ORDER — ENOXAPARIN SODIUM 40 MG/0.4ML ~~LOC~~ SOLN
40.0000 mg | Freq: Two times a day (BID) | SUBCUTANEOUS | Status: DC
  Administered 2015-11-26: 40 mg via SUBCUTANEOUS
  Filled 2015-11-25 (×2): qty 0.4

## 2015-11-25 MED ORDER — ONDANSETRON HCL 4 MG/2ML IJ SOLN
4.0000 mg | Freq: Once | INTRAMUSCULAR | Status: AC
  Administered 2015-11-25: 4 mg via INTRAVENOUS
  Filled 2015-11-25: qty 2

## 2015-11-25 MED ORDER — SENNOSIDES-DOCUSATE SODIUM 8.6-50 MG PO TABS
1.0000 | ORAL_TABLET | Freq: Every evening | ORAL | Status: DC | PRN
  Administered 2015-11-26: 1 via ORAL
  Filled 2015-11-25: qty 1

## 2015-11-25 MED ORDER — ACETAMINOPHEN 325 MG PO TABS
650.0000 mg | ORAL_TABLET | Freq: Once | ORAL | Status: AC
  Administered 2015-11-25: 650 mg via ORAL
  Filled 2015-11-25: qty 2

## 2015-11-25 MED ORDER — ONDANSETRON HCL 4 MG/2ML IJ SOLN: 4.0000 mg | Freq: Four times a day (QID) | INTRAMUSCULAR | Status: DC | PRN

## 2015-11-25 MED ORDER — POTASSIUM CHLORIDE IN NACL 20-0.45 MEQ/L-% IV SOLN
INTRAVENOUS | Status: DC
  Administered 2015-11-25: via INTRAVENOUS
  Filled 2015-11-25 (×6): qty 1000

## 2015-11-25 MED ORDER — DEXTROSE 5 % IV SOLN
2.0000 g | INTRAVENOUS | Status: DC
  Administered 2015-11-26: 2 g via INTRAVENOUS
  Filled 2015-11-25: qty 2

## 2015-11-25 MED ORDER — DEXTROSE 5 % IV SOLN
2.0000 g | Freq: Once | INTRAVENOUS | Status: AC
  Administered 2015-11-25: 2 g via INTRAVENOUS
  Filled 2015-11-25: qty 2

## 2015-11-25 NOTE — Progress Notes (Signed)
Pharmacy Antibiotic Note  Andrea Chang is a 20 y.o. female admitted on 11/25/2015 with urosepsis.  Pharmacy has been consulted for ceftriaxone dosing.  Plan: Ceftriaxone 2 gm IV Q24H  Height: 5\' 6"  (167.6 cm) Weight: 277 lb (125.646 kg) IBW/kg (Calculated) : 59.3  Temp (24hrs), Avg:101.4 F (38.6 C), Min:99.6 F (37.6 C), Max:102.9 F (39.4 C)   Recent Labs Lab 11/25/15 1725 11/25/15 2009  WBC 15.3*  --   CREATININE 0.78  --   LATICACIDVEN 0.9 0.7    Estimated Creatinine Clearance: 151.9 mL/min (by C-G formula based on Cr of 0.78).    No Known Allergies   Thank you for allowing pharmacy to be a part of this patient's care.  Andrea Chang, Pharm.D., BCPS Clinical Pharmacist 11/25/2015 9:57 PM

## 2015-11-25 NOTE — ED Provider Notes (Signed)
[facility] Emergency Department Provider Note   ____________________________________________  Time seen: Approximately 5:15 PM  I have reviewed the triage vital signs and the nursing notes.   HISTORY  Chief Complaint Flank Pain   HPI Andrea Chang is a 20 y.o. female with a history of UTIs who is presenting to emergency Department today with right-sided flank pain that started this morning. Says that she has also had urinary frequency. Says that she has nausea but no vomiting. No diarrhea. Denies abdominal pain. Says that she has a light vaginal discharge was her baseline. Was treated for Trichomonas about one month ago after having an elective abortion. Denies any vaginal bleeding.Says that the pain is 8 out of 10 and sharp. Does not report any history of kidney stones.   Past Medical History  Diagnosis Date  . Normal cardiac stress test   . Ectopic pregnancy     There are no active problems to display for this patient.   Past Surgical History  Procedure Laterality Date  . Cesarean section    . Ectopic pregnancy surgery      Current Outpatient Rx  Name  Route  Sig  Dispense  Refill  . metroNIDAZOLE (FLAGYL) 500 MG tablet   Oral   Take 1 tablet (500 mg total) by mouth 2 (two) times daily.   14 tablet   0   . naproxen sodium (ANAPROX DS) 550 MG tablet   Oral   Take 1 tablet (550 mg total) by mouth 2 (two) times daily with a meal.   14 tablet   0   . ondansetron (ZOFRAN ODT) 4 MG disintegrating tablet   Oral   Take 1 tablet (4 mg total) by mouth every 8 (eight) hours as needed for nausea or vomiting.   20 tablet   0     Allergies Review of patient's allergies indicates no known allergies.  No family history on file.  Social History Social History  Substance Use Topics  . Smoking status: Never Smoker   . Smokeless tobacco: Never Used  . Alcohol Use: No    Review of Systems Constitutional: No fever/chills Eyes: No  visual changes. ENT: No sore throat. Cardiovascular: Denies chest pain. Respiratory: Denies shortness of breath. Gastrointestinal: No abdominal pain.  no vomiting.  No diarrhea.  No constipation. Genitourinary: As above Musculoskeletal: Right-sided flank pain. Skin: Negative for rash. Neurological: Negative for headaches, focal weakness or numbness.  10-point ROS otherwise negative.  ____________________________________________   PHYSICAL EXAM:  VITAL SIGNS: ED Triage Vitals  Enc Vitals Group     BP 11/25/15 1708 96/56 mmHg     Pulse Rate 11/25/15 1708 127     Resp 11/25/15 1708 20     Temp 11/25/15 1708 102.9 F (39.4 C)     Temp Source 11/25/15 1708 Oral     SpO2 11/25/15 1708 100 %     Weight 11/25/15 1708 277 lb (125.646 kg)     Height 11/25/15 1708 5\' 6"  (1.676 m)     Head Cir --      Peak Flow --      Pain Score 11/25/15 1710 8     Pain Loc --      Pain Edu? --      Excl. in GC? --     Constitutional: Alert and oriented. Well appearing and in no acute distress. Eyes: Conjunctivae are normal. PERRL. EOMI. Head: Atraumatic. Nose: No congestion/rhinnorhea. Mouth/Throat: Mucous membranes are moist.  Neck: No stridor.   Cardiovascular: Cardiac, regular rhythm. Grossly normal heart sounds.  Good peripheral circulation. Respiratory: Normal respiratory effort.  No retractions. Lungs CTAB. Gastrointestinal: Soft and nontender. No distention. Mild right-sided CVA tenderness to palpation. Musculoskeletal: No lower extremity tenderness nor edema.  No joint effusions. Neurologic:  Normal speech and language. No gross focal neurologic deficits are appreciated. No gait instability. Skin:  Skin is warm, dry and intact. No rash noted. Psychiatric: Mood and affect are normal. Speech and behavior are normal.  ____________________________________________   LABS (all labs ordered are listed, but only abnormal results are displayed)  Labs Reviewed  COMPREHENSIVE METABOLIC  PANEL - Abnormal; Notable for the following:    Potassium 3.1 (*)    ALT 12 (*)    All other components within normal limits  HCG, QUANTITATIVE, PREGNANCY - Abnormal; Notable for the following:    hCG, Beta Chain, Quant, S 5 (*)    All other components within normal limits  CBC WITH DIFFERENTIAL/PLATELET - Abnormal; Notable for the following:    WBC 15.3 (*)    Hemoglobin 10.7 (*)    HCT 32.9 (*)    MCV 76.4 (*)    MCH 24.9 (*)    RDW 17.6 (*)    Neutro Abs 11.9 (*)    Monocytes Absolute 1.5 (*)    All other components within normal limits  URINALYSIS COMPLETEWITH MICROSCOPIC ([facility] ONLY) - Abnormal; Notable for the following:    Color, Urine YELLOW (*)    APPearance CLOUDY (*)    Ketones, ur 2+ (*)    Hgb urine dipstick 2+ (*)    Protein, ur 30 (*)    Nitrite POSITIVE (*)    Leukocytes, UA 3+ (*)    Bacteria, UA MANY (*)    Squamous Epithelial / LPF 0-5 (*)    All other components within normal limits  CULTURE, BLOOD (ROUTINE X 2)  CULTURE, BLOOD (ROUTINE X 2)  URINE CULTURE  LACTIC ACID, PLASMA  LACTIC ACID, PLASMA  PREGNANCY, URINE  HCG, QUANTITATIVE, PREGNANCY   ____________________________________________  EKG   ____________________________________________  RADIOLOGY   ____________________________________________   PROCEDURES   ____________________________________________   INITIAL IMPRESSION / ASSESSMENT AND PLAN / ED COURSE  Pertinent labs & imaging results that were available during my care of the patient were reviewed by me and considered in my medical decision making (see chart for details).  Sepsis alert called. Likely pyelonephritis. Ceftriaxone ordered as well as fluid bolus.  ----------------------------------------- 9:05 PM on 11/25/2015 -----------------------------------------  Patient still tachycardic to the 1 teens after 2 L of fluid. Other vital signs had improved. We'll admit for further workup and treatment. Signed out to Dr.  Sheryle Haildiamond. The patient is well-developed family aware of the plan and need for further treatment. They're understanding what to comply. ____________________________________________   FINAL CLINICAL IMPRESSION(S) / ED DIAGNOSES  Final diagnoses:  Right flank pain  Pyelonephritis. Sepsis.    NEW MEDICATIONS STARTED DURING THIS VISIT:  New Prescriptions   No medications on file     Note:  This document was prepared using Dragon voice recognition software and may include unintentional dictation errors.    Myrna Blazeravid Matthew Schaevitz, MD 11/25/15 2106

## 2015-11-25 NOTE — ED Notes (Signed)
Patient transported to CT 

## 2015-11-25 NOTE — ED Notes (Signed)
Transporting patient to room 142

## 2015-11-25 NOTE — ED Notes (Signed)
R flank pain since this am, denies dysuria.

## 2015-11-25 NOTE — ED Notes (Signed)
Patient transported to X-ray 

## 2015-11-25 NOTE — ED Notes (Signed)
Patient transported to Ultrasound 

## 2015-11-25 NOTE — H&P (Signed)
PCP:   Myrene BuddyGAUGER, SARAH KATHRYN, NP   Chief Complaint:  Fever  HPI: This is [age] year old female who presents to the ER after she had a fever of 102.5 at home. She states she had the right sided pain. She denies any burning urination or blood in urine. She did have some nausea today but no vomiting. Review persist that she came to ER. In the ER she was noted to have pyelonephritis. Hospitalists have been asked to admit. In the ER her temperature went up to 102.9.  Review of Systems:  The patient denies anorexia, fever, weight loss,, vision loss, decreased hearing, hoarseness, chest pain, syncope, dyspnea on exertion, peripheral edema, balance deficits, hemoptysis, abdominal pain, melena, hematochezia, severe indigestion/heartburn, hematuria, incontinence, genital sores, muscle weakness, suspicious skin lesions, transient blindness, difficulty walking, depression, unusual weight change, abnormal bleeding, enlarged lymph nodes, angioedema, and breast masses.  Past Medical History: Past Medical History  Diagnosis Date  . Normal cardiac stress test   . Ectopic pregnancy    Past Surgical History  Procedure Laterality Date  . Cesarean section    . Ectopic pregnancy surgery      Medications: Prior to Admission medications   Medication Sig Start Date End Date Taking? Authorizing Provider  metroNIDAZOLE (FLAGYL) 500 MG tablet Take 1 tablet (500 mg total) by mouth 2 (two) times daily. 10/03/15   Loleta Roseory Forbach, MD  naproxen sodium (ANAPROX DS) 550 MG tablet Take 1 tablet (550 mg total) by mouth 2 (two) times daily with a meal. 06/03/15   Tommi Rumpshonda L Summers, PA-C  ondansetron (ZOFRAN ODT) 4 MG disintegrating tablet Take 1 tablet (4 mg total) by mouth every 8 (eight) hours as needed for nausea or vomiting. 06/03/15   Tommi Rumpshonda L Summers, PA-C    Allergies:  No Known Allergies  Social History:  reports that she has never smoked. She has never used smokeless tobacco. She reports that she does not drink  alcohol or use illicit drugs.  Family History: Diabetes mellitus type II  Physical Exam: Filed Vitals:   11/25/15 1830 11/25/15 2000 11/25/15 2001 11/25/15 2039  BP: 113/73 110/68 110/68   Pulse: 111 114 107   Temp:   101.7 F (38.7 C) 99.6 F (37.6 C)  TempSrc:   Oral Oral  Resp: 19 26 18    Height:      Weight:      SpO2: 100% 100% 100%     General:  Alert and oriented times three, well developed and nourished, no acute distress Eyes: PERRLA, pink conjunctiva, no scleral icterus ENT: Moist oral mucosa, neck supple, no thyromegaly Lungs: clear to ascultation, no wheeze, no crackles, no use of accessory muscles Cardiovascular: regular rate and rhythm, no regurgitation, no gallops, no murmurs. No carotid bruits, no JVD Abdomen: soft, positive BS, non-tender, non-distended, no organomegaly, not an acute abdomen GU: not examined Neuro: CN II - XII grossly intact, sensation intact Musculoskeletal: strength 5/5 all extremities, no clubbing, cyanosis or edema Skin: no rash, no subcutaneous crepitation, no decubitus Psych: appropriate patient   Labs on Admission:   Recent Labs  11/25/15 1725  NA 138  K 3.1*  CL 105  CO2 22  GLUCOSE 92  BUN 8  CREATININE 0.78  CALCIUM 9.0    Recent Labs  11/25/15 1725  AST 16  ALT 12*  ALKPHOS 45  BILITOT 0.3  PROT 7.7  ALBUMIN 4.0   No results for input(s): LIPASE, AMYLASE in the last 72 hours.  Recent Labs  11/25/15 1725  WBC 15.3*  NEUTROABS 11.9*  HGB 10.7*  HCT 32.9*  MCV 76.4*  PLT 232   No results for input(s): CKTOTAL, CKMB, CKMBINDEX, TROPONINI in the last 72 hours. Invalid input(s): POCBNP No results for input(s): DDIMER in the last 72 hours. No results for input(s): HGBA1C in the last 72 hours. No results for input(s): CHOL, HDL, LDLCALC, TRIG, CHOLHDL, LDLDIRECT in the last 72 hours. No results for input(s): TSH, T4TOTAL, T3FREE, THYROIDAB in the last 72 hours.  Invalid input(s): FREET3 No results for  input(s): VITAMINB12, FOLATE, FERRITIN, TIBC, IRON, RETICCTPCT in the last 72 hours.  Micro Results:   Results for Drue FlirtMCGHEE, Chancey D (MRN [id number]) as of 11/25/2015 21:12  Ref. Range 11/25/2015 17:41  Appearance Latest Ref Range: CLEAR  CLOUDY (A)  Bacteria, UA Latest Ref Range: NONE SEEN  MANY (A)  Bilirubin Urine Latest Ref Range: NEGATIVE  NEGATIVE  Color, Urine Latest Ref Range: YELLOW  YELLOW (A)  Glucose Latest Ref Range: NEGATIVE mg/dL NEGATIVE  Hgb urine dipstick Latest Ref Range: NEGATIVE  2+ (A)  Ketones, ur Latest Ref Range: NEGATIVE mg/dL 2+ (A)  Leukocytes, UA Latest Ref Range: NEGATIVE  3+ (A)  Mucous Unknown PRESENT  Nitrite Latest Ref Range: NEGATIVE  POSITIVE (A)  pH Latest Ref Range: 5.0-8.0  6.0  Protein Latest Ref Range: NEGATIVE mg/dL 30 (A)  RBC / HPF Latest Ref Range: 0-5 RBC/hpf 0-5  Specific Gravity, Urine Latest Ref Range: 1.005-1.030  1.014  Squamous Epithelial / LPF Latest Ref Range: NONE SEEN  0-5 (A)  WBC Clumps Unknown PRESENT  WBC, UA Latest Ref Range: 0-5 WBC/hpf TOO NUMEROUS TO C...  Preg Test, Ur Latest Ref Range: NEGATIVE  NEGATIVE   Radiological Exams on Admission: Dg Chest 2 View  11/25/2015  CLINICAL DATA:  Acute onset of fever and tachycardia. Right flank pain. Initial encounter. EXAM: CHEST  2 VIEW COMPARISON:  None. FINDINGS: The lungs are well-aerated and clear. There is no evidence of focal opacification, pleural effusion or pneumothorax. The heart is normal in size; the mediastinal contour is within normal limits. No acute osseous abnormalities are seen. IMPRESSION: No acute cardiopulmonary process seen. Electronically Signed   By: Roanna RaiderJeffery  Chang M.D.   On: 11/25/2015 18:12   [place] Renal  11/25/2015  CLINICAL DATA:  Right flank pain. EXAM: RENAL / URINARY TRACT ULTRASOUND COMPLETE COMPARISON:  None. FINDINGS: Right Kidney: Length: 11.3 cm. Echogenicity within normal limits. No mass or hydronephrosis visualized. Left Kidney: Length: 11 cm.  Echogenicity within normal limits. No mass or hydronephrosis visualized. Bladder: Appears normal for degree of bladder distention. IMPRESSION: Normal.  No cause for right flank pain identified. Electronically Signed   By: Gerome Samavid  Williams III M.D   On: 11/25/2015 19:52    Assessment/Plan Present on Admission:  . Pyelonephritis . Sepsis (HCC) -Admit to MedSurg -Cultures collected and pending. - IV antibiotics of Rocephin ordered. We'll continue -Tylenol when necessary fever and pain  Hypokalemia -Replete in IV fluids. Repeat BMP in a.m.   Shamone Winzer 11/25/2015, 9:16 PM

## 2015-11-26 LAB — BASIC METABOLIC PANEL
ANION GAP: 6 (ref 5–15)
BUN: 5 mg/dL — AB (ref 6–20)
CALCIUM: 7.8 mg/dL — AB (ref 8.9–10.3)
CO2: 22 mmol/L (ref 22–32)
Chloride: 110 mmol/L (ref 101–111)
Creatinine, Ser: 0.71 mg/dL (ref 0.44–1.00)
GFR calc Af Amer: >60 mL/min (ref >60)
GLUCOSE: 102 mg/dL — AB (ref 65–99)
Potassium: 3.3 mmol/L — ABNORMAL LOW (ref 3.5–5.1)
Sodium: 138 mmol/L (ref 135–145)

## 2015-11-26 LAB — CBC
HCT: 30.3 % — ABNORMAL LOW (ref 35.0–47.0)
HEMOGLOBIN: 9.6 g/dL — AB (ref 12.0–16.0)
MCH: 24.8 pg — AB (ref 26.0–34.0)
MCHC: 31.7 g/dL — AB (ref 32.0–36.0)
MCV: 78.3 fL — ABNORMAL LOW (ref 80.0–100.0)
Platelets: 194 10*3/uL (ref 150–440)
RBC: 3.87 MIL/uL (ref 3.80–5.20)
RDW: 17.4 % — AB (ref 11.5–14.5)
WBC: 12.5 10*3/uL — ABNORMAL HIGH (ref 3.6–11.0)

## 2015-11-26 MED ORDER — CEPHALEXIN 250 MG PO CAPS: 500.0000 mg | ORAL_CAPSULE | Freq: Two times a day (BID) | ORAL | Status: DC

## 2015-11-26 MED ORDER — CEFUROXIME AXETIL 500 MG PO TABS: 500.0000 mg | ORAL_TABLET | Freq: Two times a day (BID) | ORAL | Status: DC

## 2015-11-26 MED ORDER — CEPHALEXIN 500 MG PO CAPS: 500.0000 mg | ORAL_CAPSULE | Freq: Four times a day (QID) | ORAL | Status: DC

## 2015-11-26 NOTE — Progress Notes (Signed)
Rescheduled next dose rocephin for 1600, pt requests discharge this evening. If pt remains afebrile, ok to discharge after abx per MD. If fever over 100.5, pt cannot discharge. Rx for Ceftin at discharge. Will continue to monitor.

## 2015-11-26 NOTE — Discharge Summary (Signed)
[facility] [place] at [facility]   PATIENT NAME: Andrea Chang    MR#:  [id number]  DATE OF BIRTH:  [birth year]  DATE OF ADMISSION:  11/25/2015 ADMITTING PHYSICIAN: Gery Pray, MD  DATE OF DISCHARGE: 11/26/15  PRIMARY CARE PHYSICIAN: Myrene Buddy, NP    ADMISSION DIAGNOSIS:  Pyelonephritis [N12] Right flank pain [R10.9] Sepsis, due to unspecified organism (HCC) [A41.9]  DISCHARGE DIAGNOSIS:  Acute Cystitis Right flank pain-resolved  SECONDARY DIAGNOSIS:   Past Medical History  Diagnosis Date  . Normal cardiac stress test   . Ectopic pregnancy     HOSPITAL COURSE:  . Sepsis (HCC) due to UTI-improved  -Blood Cultures negative. UC pending - IV antibiotics of Rocephin ordered. ---change to po keflex -Tylenol when necessary fever and pain -wbc trending down -pt has not flank pain -US renal nefative  Hypokalemia -Replete with IV fluids.   -overall improving. Pt requesting to go home tonite. Worried about her son's care at home. adivsed her to take IV rocephin tonite and as long as she is afebrile she can go home. Pt agreeable CONSULTS OBTAINED:     DRUG ALLERGIES:  No Known Allergies  DISCHARGE MEDICATIONS:   Current Discharge Medication List    START taking these medications   Details  cephALEXin (KEFLEX) 500 MG capsule Take 1 capsule (500 mg total) by mouth 4 (four) times daily. Qty: 14 capsule, Refills: 0      CONTINUE these medications which have NOT CHANGED   Details  naproxen sodium (ANAPROX DS) 550 MG tablet Take 1 tablet (550 mg total) by mouth 2 (two) times daily with a meal. Qty: 14 tablet, Refills: 0    ondansetron (ZOFRAN ODT) 4 MG disintegrating tablet Take 1 tablet (4 mg total) by mouth every 8 (eight) hours as needed for nausea or vomiting. Qty: 20 tablet, Refills: 0      STOP taking these medications     metroNIDAZOLE (FLAGYL) 500 MG tablet         If you experience worsening of your  admission symptoms, develop shortness of breath, life threatening emergency, suicidal or homicidal thoughts you must seek medical attention immediately by calling 911 or calling your MD immediately  if symptoms less severe.  You Must read complete instructions/literature along with all the possible adverse reactions/side effects for all the Medicines you take and that have been prescribed to you. Take any new Medicines after you have completely understood and accept all the possible adverse reactions/side effects.   Please note  You were cared for by a hospitalist during your hospital stay. If you have any questions about your discharge medications or the care you received while you were in the hospital after you are discharged, you can call the unit and asked to speak with the hospitalist on call if the hospitalist that took care of you is not available. Once you are discharged, your primary care physician will handle any further medical issues. Please note that NO REFILLS for any discharge medications will be authorized once you are discharged, as it is imperative that you return to your primary care physician (or establish a relationship with a primary care physician if you do not have one) for your aftercare needs so that they can reassess your need for medications and monitor your lab values. Today   SUBJECTIVE   Requesting to go home Low grade fever No nausea or vomitng  VITAL SIGNS:  Blood pressure 115/44, pulse 99, temperature 100.7 F (38.2 C),  temperature source Oral, resp. rate 18, height 5\' 6"  (1.676 m), weight 125.646 kg (277 lb), last menstrual period 07/28/2015, SpO2 100 %.  I/O:   Intake/Output Summary (Last 24 hours) at 11/26/15 1141 Last data filed at 11/26/15 [id number]  Gross per 24 hour  Intake 4856.25 ml  Output    100 ml  Net 4756.25 ml    PHYSICAL EXAMINATION:  GENERAL:  20 y.o.-year-old patient lying in the bed with no acute distress.  EYES: Pupils equal, round,  reactive to light and accommodation. No scleral icterus. Extraocular muscles intact.  HEENT: Head atraumatic, normocephalic. Oropharynx and nasopharynx clear.  NECK:  Supple, no jugular venous distention. No thyroid enlargement, no tenderness.  LUNGS: Normal breath sounds bilaterally, no wheezing, rales,rhonchi or crepitation. No use of accessory muscles of respiration.  CARDIOVASCULAR: S1, S2 normal. No murmurs, rubs, or gallops.  ABDOMEN: Soft, non-tender, non-distended. Bowel sounds present. No organomegaly or mass.  EXTREMITIES: No pedal edema, cyanosis, or clubbing.  NEUROLOGIC: Cranial nerves II through XII are intact. Muscle strength 5/5 in all extremities. Sensation intact. Gait not checked.  PSYCHIATRIC: The patient is alert and oriented x 3.  SKIN: No obvious rash, lesion, or ulcer.   DATA REVIEW:   CBC   Recent Labs Lab 11/26/15 0545  WBC 12.5*  HGB 9.6*  HCT 30.3*  PLT 194    Chemistries   Recent Labs Lab 11/25/15 1725 11/26/15 0545  NA 138 138  K 3.1* 3.3*  CL 105 110  CO2 22 22  GLUCOSE 92 102*  BUN 8 5*  CREATININE 0.78 0.71  CALCIUM 9.0 7.8*  AST 16  --   ALT 12*  --   ALKPHOS 45  --   BILITOT 0.3  --     Microbiology Results   Recent Results (from the past 240 hour(s))  Culture, blood (Routine x 2)     Status: None (Preliminary result)   Collection Time: 11/25/15  5:25 PM  Result Value Ref Range Status   Specimen Description BLOOD LEFT ASSIST CONTROL  Final   Special Requests BOTTLES DRAWN AEROBIC AND ANAEROBIC  1CC  Final   Culture NO GROWTH < 12 HOURS  Final   Report Status PENDING  Incomplete  Culture, blood (Routine x 2)     Status: None (Preliminary result)   Collection Time: 11/25/15  5:35 PM  Result Value Ref Range Status   Specimen Description BLOOD RIGHT ASSIST CONTROL  Final   Special Requests BOTTLES DRAWN AEROBIC AND ANAEROBIC  2CC  Final   Culture NO GROWTH < 12 HOURS  Final   Report Status PENDING  Incomplete    RADIOLOGY:   Dg Chest 2 View  11/25/2015  CLINICAL DATA:  Acute onset of fever and tachycardia. Right flank pain. Initial encounter. EXAM: CHEST  2 VIEW COMPARISON:  None. FINDINGS: The lungs are well-aerated and clear. There is no evidence of focal opacification, pleural effusion or pneumothorax. The heart is normal in size; the mediastinal contour is within normal limits. No acute osseous abnormalities are seen. IMPRESSION: No acute cardiopulmonary process seen. Electronically Signed   By: Roanna RaiderJeffery  Chang M.D.   On: 11/25/2015 18:12   [place]as Renal  11/25/2015  CLINICAL DATA:  Right flank pain. EXAM: RENAL / URINARY TRACT ULTRASOUND COMPLETE COMPARISON:  None. FINDINGS: Right Kidney: Length: 11.3 cm. Echogenicity within normal limits. No mass or hydronephrosis visualized. Left Kidney: Length: 11 cm. Echogenicity within normal limits. No mass or hydronephrosis visualized. Bladder: Appears normal for degree of  bladder distention. IMPRESSION: Normal.  No cause for right flank pain identified. Electronically Signed   By: Gerome Samavid  Williams III M.D   On: 11/25/2015 19:52     Management plans discussed with the patient, family and they are in agreement.  CODE STATUS:     Code Status Orders        Start     Ordered   11/25/15 2229  Full code   Continuous     11/25/15 2228    Code Status History    Date Active Date Inactive Code Status Order ID Comments User Context   This patient has a current code status but no historical code status.      TOTAL TIME TAKING CARE OF THIS PATIENT: 40 minutes.    Bader Stubblefield M.D on 11/26/2015 at 11:41 AM  Between 7am to 6pm - Pager - [phone] After 6pm go to [email or web address] - password [email or web address] [facility]  [facility]  Office  [phone]  CC: Primary care physician; Myrene BuddyGAUGER, SARAH KATHRYN, NP

## 2015-11-26 NOTE — Progress Notes (Signed)
Pt eager to d/c from hospital. Temp elevated at 100.6 orally, tylenol given, rechecked 1 hours later, temp is 100.8. Notified MD. Dr. Allena KatzPatel does not recommend discharge, advised for pt to stay overnight for further monitoring, additional abx. Pt does not wish to stay, requests discharge despite MD advice. Patient became very upset, yelling at her mother in pt room, stating "I have to leave NOW".  MD notified. Per Dr. Allena KatzPatel, pt can discharge with Rx's and understanding that her condition may worsen. Advised pt of this, and if her symptoms & condition worsen she could become septic. Educated pt on s/s of sepsis, infection, how to take medications, discharge instructions reviewed. [facility], pt will pick up when she leaves hospital. Pt's mother is at bedside, will transport pt home. See additional note for conversation with pt's mother.

## 2015-11-26 NOTE — Progress Notes (Signed)
Pt's mother Stanton Kidney(Debra) approached this Clinical research associatewriter outside pt's room regarding pt and her child's welfare and safety. She explained that pt has left home and lives with her boyfriend and "questionable persons" at this time along with her 533 yr old son. Explained that when pt was admitted to hospital last night, her child was left with her boyfriend who is "on the run from the police". The child was involved in a high speed chase in [place], then "dumped at his homeboy's house". The boyfriend has also come to [facility] and taken the pt's bank card and EBT card overnight with her permission. The patient's mother was contacted and was able to find the child and has taken him to her home while pt is admitted. Pt's mother is very concerned for pt's safety and child's safety because they will be returning to the person's house who is wanted by police. Requests contact from social worker and DSS as soon as possible. Daughter adamant that her mother stay out of her business. Information given to pt's mother to contact DSS and child protective services. Paged SW in ED, left message to please contact Oliver BarreDebra Malone at [phone].

## 2015-11-28 LAB — URINE CULTURE: Culture: >=100000 — AB

## 2015-11-30 LAB — CULTURE, BLOOD (ROUTINE X 2)
CULTURE: NO GROWTH
CULTURE: NO GROWTH

## 2016-10-03 ENCOUNTER — Emergency Department
Admission: EM | Admit: 2016-10-03 | Discharge: 2016-10-03 | Disposition: A | Discharge status: Home / Self Care | Payer: 59 | Attending: Emergency Medicine | Admitting: Emergency Medicine

## 2016-10-03 ENCOUNTER — Encounter: Payer: Self-pay | Admitting: *Deleted

## 2016-10-03 DIAGNOSIS — N921 Excessive and frequent menstruation with irregular cycle: Secondary | ICD-10-CM | POA: Diagnosis not present

## 2016-10-03 HISTORY — DX: Excessive and frequent menstruation with irregular cycle: N92.1

## 2016-10-03 LAB — CBC
HCT: 28.4 % — ABNORMAL LOW (ref 35.0–47.0)
Hemoglobin: 8.9 g/dL — ABNORMAL LOW (ref 12.0–16.0)
MCH: 23.9 pg — AB (ref 26.0–34.0)
MCHC: 31.3 g/dL — AB (ref 32.0–36.0)
MCV: 76.3 fL — ABNORMAL LOW (ref 80.0–100.0)
Platelets: 298 10*3/uL (ref 150–440)
RBC: 3.72 MIL/uL — ABNORMAL LOW (ref 3.80–5.20)
RDW: 18.4 % — AB (ref 11.5–14.5)
WBC: 9.9 10*3/uL (ref 3.6–11.0)

## 2016-10-03 LAB — POCT PREGNANCY, URINE: PREG TEST UR: NEGATIVE

## 2016-10-03 MED ORDER — NORGESTIMATE-ETH ESTRADIOL 0.25-35 MG-MCG PO TABS: 1.0000 | ORAL_TABLET | Freq: Every day | ORAL | 2 refills | Status: DC

## 2016-10-03 NOTE — ED Triage Notes (Signed)
States vaginal bleeding since 4/15, states heavy bleeding with clots and lower abd cramping, states SOB with exertion and feeling weak, awake and alert in no acute distress

## 2016-10-03 NOTE — Discharge Instructions (Signed)
As we discussed, although you are having heavy vaginal bleeding, it is not dangerous at this time.  The plan at this time is for you to start taking the birth control pills prescribed as written and to follow-up with the GYN doctor indicated in this document. Please also remember to start taking an iron supplement (available over-the-counter).  Drink plenty of fluids to stay hydrated.   Please return to the emergency department if you develop any new or worsening symptoms that concern you.  Remember that tobacco use greatly increases her risk of developing blood clots while taking birth control pills and avoid smoking or using any other tobacco products.

## 2016-10-03 NOTE — ED Provider Notes (Signed)
[facility] Emergency Department Provider Note  ____________________________________________   First MD Initiated Contact with Patient 10/03/16 2002     (approximate)  I have reviewed the triage vital signs and the nursing notes.   HISTORY  Chief Complaint Vaginal Bleeding    HPI Andrea Chang is a 21 y.o. female who previously has been diagnosed with menometrorrhagia or dysfunctional uterine bleeding requiring birth control pills (this was about 5 months ago) who is no longer on birth control pills presents by private vehicle for evaluation of heavy vaginal bleeding.  She reports that she started having a normal period between 2 and 3 weeks ago, but after 8 days when it was starting to become lighter it then picked back up again and has been steady and headache since then.  She changes 5-6 pads a day.  She is started to feel a little bit dizzy over the last 1-2 days.  She reports that the bleeding is severe and nothing makes it better or worse and that the dizziness is mild.  She denies headache, nausea, vomiting, abdominal pain, shortness of breath, chest pain, dysuria.  She has had no focal numbness or weakness in any of her extremities.  She is not pregnant.  She reports that she took birth control pills about 5 months ago when she was originally diagnosed in at fix the problem, but then she moved away from her doctor and has not had any oral contraceptive since then.  She has no local doctor so she has not started back on the oral contraceptives.  She was told to take iron pills previously but stopped doing so because she does not like to swallow pills.   Past Medical History:  Diagnosis Date  . Ectopic pregnancy   . Menometrorrhagia   . Normal cardiac stress test     Patient Active Problem List   Diagnosis Date Noted  . Pyelonephritis 11/25/2015  . Sepsis (HCC) 11/25/2015    Past Surgical History:  Procedure Laterality Date  . CESAREAN SECTION     . ECTOPIC PREGNANCY SURGERY      Prior to Admission medications   Medication Sig Start Date End Date Taking? Authorizing Provider  cephALEXin (KEFLEX) 500 MG capsule Take 1 capsule (500 mg total) by mouth 4 (four) times daily. 11/26/15   Enedina Finner, MD  naproxen sodium (ANAPROX DS) 550 MG tablet Take 1 tablet (550 mg total) by mouth 2 (two) times daily with a meal. 06/03/15   Tommi Rumps, PA-C  norgestimate-ethinyl estradiol (ORTHO-CYCLEN,SPRINTEC,PREVIFEM) 0.25-35 MG-MCG tablet Take 1 tablet by mouth daily. 10/03/16   Loleta Rose, MD  ondansetron (ZOFRAN ODT) 4 MG disintegrating tablet Take 1 tablet (4 mg total) by mouth every 8 (eight) hours as needed for nausea or vomiting. 06/03/15   Tommi Rumps, PA-C    Allergies Patient has no known allergies.  History reviewed. No pertinent family history.  Social History Social History  Substance Use Topics  . Smoking status: Never Smoker  . Smokeless tobacco: Never Used  . Alcohol use No    Review of Systems Constitutional: No fever/chills Eyes: No visual changes. ENT: No sore throat. Cardiovascular: Denies chest pain. Respiratory: Denies shortness of breath. Gastrointestinal: No abdominal pain.  No nausea, no vomiting.  No diarrhea.  No constipation. Genitourinary: Negative for dysuria.  Heavy vaginal bleeding for about 2 weeks Musculoskeletal: Negative for back pain. Integumentary: Negative for rash. Neurological: Negative for headaches, focal weakness or numbness.  Mild dizziness recently  ____________________________________________   PHYSICAL EXAM:  VITAL SIGNS: ED Triage Vitals [10/03/16 1827]  Enc Vitals Group     BP 125/62     Pulse Rate 99     Resp 18     Temp 98.5 F (36.9 C)     Temp Source Oral     SpO2 100 %     Weight 254 lb (115.2 kg)     Height 5\' 7"  (1.702 m)     Head Circumference      Peak Flow      Pain Score      Pain Loc      Pain Edu?      Excl. in GC?     Constitutional: Alert  and oriented. Well appearing and in no acute distress. Eyes: Conjunctivae are normal. PERRL. EOMI. Head: Atraumatic. Nose: No congestion/rhinnorhea. Mouth/Throat: Mucous membranes are moist. Neck: No stridor.  No meningeal signs.   Cardiovascular: Normal rate, regular rhythm. Good peripheral circulation. Grossly normal heart sounds. Respiratory: Normal respiratory effort.  No retractions. Lungs CTAB. Gastrointestinal: Soft and nontender. No distention.  GU: deferred Musculoskeletal: No lower extremity tenderness nor edema. No gross deformities of extremities. Neurologic:  Normal speech and language. No gross focal neurologic deficits are appreciated.  Ambulatory without difficulty and no dizziness when she stands up. Skin:  Skin is warm, dry and intact. No rash noted. Psychiatric: Mood and affect are normal. Speech and behavior are normal.    ____________________________________________   LABS (all labs ordered are listed, but only abnormal results are displayed)  Labs Reviewed  CBC - Abnormal; Notable for the following:       Result Value   RBC 3.72 (*)    Hemoglobin 8.9 (*)    HCT 28.4 (*)    MCV 76.3 (*)    MCH 23.9 (*)    MCHC 31.3 (*)    RDW 18.4 (*)    All other components within normal limits  POCT PREGNANCY, URINE  POC URINE PREG, ED   ____________________________________________  EKG  None - EKG not ordered by ED physician ____________________________________________  RADIOLOGY   No results found.  ____________________________________________   PROCEDURES  Critical Care performed: No   Procedure(s) performed:   Procedures   ____________________________________________   INITIAL IMPRESSION / ASSESSMENT AND PLAN / ED COURSE  Pertinent labs & imaging results that were available during my care of the patient were reviewed by me and considered in my medical decision making (see chart for details).  The patient is well-appearing and in no acute  distress with normal vital signs.  She reports some subjective symptoms of dizziness or lightheadedness but does not have any on exam today when she stands up and has a normal neurological exam.  Her hemoglobin has dropped slightly but it is still at about 9 and she does not require transfusion at this time.  We had a long talk about her taking her medications including her iron supplements and she promises to do so.  She has an appointment with a GYN in 4 days.  I will start her on Sprintec and I stressed to her the importance of outpatient follow-up and staying hydrated.  I gave my usual and customary return precautions.  We discussed a GU exam but we both agreed that it is not necessary at this time given her negative pregnancy test and plans for close GYN follow up.       ____________________________________________  FINAL CLINICAL IMPRESSION(S) / ED DIAGNOSES  Final diagnoses:  Menometrorrhagia     MEDICATIONS GIVEN DURING THIS VISIT:  Medications - No data to display   NEW OUTPATIENT MEDICATIONS STARTED DURING THIS VISIT:  Discharge Medication List as of 10/03/2016  8:52 PM    START taking these medications   Details  norgestimate-ethinyl estradiol (ORTHO-CYCLEN,SPRINTEC,PREVIFEM) 0.25-35 MG-MCG tablet Take 1 tablet by mouth daily., Starting Thu 10/03/2016, Print        Discharge Medication List as of 10/03/2016  8:52 PM      Discharge Medication List as of 10/03/2016  8:52 PM       Note:  This document was prepared using Dragon voice recognition software and may include unintentional dictation errors.    Loleta Roseory Francena Zender, MD 10/03/16 2115

## 2016-10-07 ENCOUNTER — Encounter: Payer: Self-pay | Admitting: Advanced Practice Midwife

## 2016-10-10 ENCOUNTER — Ambulatory Visit: Payer: Self-pay | Admitting: Advanced Practice Midwife

## 2016-11-04 NOTE — Addendum Note (Signed)
Addended by: Tresea MallGLEDHILL, Matty Deamer on: 11/04/2016 12:38 PM   Modules accepted: Level of Service, SmartSet

## 2016-11-04 NOTE — Progress Notes (Signed)
This encounter was created in error - please disregard.

## 2017-02-26 ENCOUNTER — Emergency Department
Admission: EM | Admit: 2017-02-26 | Discharge: 2017-02-26 | Disposition: A | Discharge status: Home / Self Care | Payer: 59 | Attending: Emergency Medicine | Admitting: Emergency Medicine

## 2017-02-26 ENCOUNTER — Encounter: Payer: Self-pay | Admitting: Emergency Medicine

## 2017-02-26 DIAGNOSIS — N760 Acute vaginitis: Secondary | ICD-10-CM | POA: Insufficient documentation

## 2017-02-26 DIAGNOSIS — B9689 Other specified bacterial agents as the cause of diseases classified elsewhere: Secondary | ICD-10-CM

## 2017-02-26 DIAGNOSIS — A549 Gonococcal infection, unspecified: Secondary | ICD-10-CM

## 2017-02-26 DIAGNOSIS — Z79899 Other long term (current) drug therapy: Secondary | ICD-10-CM | POA: Insufficient documentation

## 2017-02-26 DIAGNOSIS — R102 Pelvic and perineal pain: Secondary | ICD-10-CM | POA: Insufficient documentation

## 2017-02-26 LAB — URINALYSIS, COMPLETE (UACMP) WITH MICROSCOPIC
BILIRUBIN URINE: NEGATIVE
Glucose, UA: NEGATIVE mg/dL
KETONES UR: NEGATIVE mg/dL
Leukocytes, UA: NEGATIVE
Nitrite: NEGATIVE
PH: 7 (ref 5.0–8.0)
Protein, ur: NEGATIVE mg/dL
Specific Gravity, Urine: 1.018 (ref 1.005–1.030)

## 2017-02-26 LAB — HCG, QUANTITATIVE, PREGNANCY

## 2017-02-26 LAB — WET PREP, GENITAL
Sperm: NONE SEEN
TRICH WET PREP: NONE SEEN
YEAST WET PREP: NONE SEEN

## 2017-02-26 LAB — CHLAMYDIA/NGC RT PCR (ARMC ONLY)
CHLAMYDIA TR: NOT DETECTED
N GONORRHOEAE: DETECTED — AB

## 2017-02-26 LAB — POCT PREGNANCY, URINE: PREG TEST UR: NEGATIVE

## 2017-02-26 MED ORDER — METRONIDAZOLE 500 MG PO TABS: 500.0000 mg | ORAL_TABLET | Freq: Two times a day (BID) | ORAL | 0 refills | Status: DC

## 2017-02-26 MED ORDER — LIDOCAINE HCL (PF) 1 % IJ SOLN
INTRAMUSCULAR | Status: AC
  Filled 2017-02-26: qty 5

## 2017-02-26 MED ORDER — CEFTRIAXONE SODIUM 250 MG IJ SOLR
250.0000 mg | Freq: Once | INTRAMUSCULAR | Status: AC
  Administered 2017-02-26: 250 mg via INTRAMUSCULAR
  Filled 2017-02-26: qty 250
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
## 2017-02-26 NOTE — ED Notes (Signed)
Pt will be discharged after appropriate wait time s/p IM injection

## 2017-02-26 NOTE — ED Triage Notes (Signed)
Pt comes into the ED via POv c/o lower abdominal cramping along with vaginal bleeding. Patient states she had her normal period 3 weeks ago and then started spotting again.  States that is causing her to have abdominal cramping.  Patient denies any dizziness, or weakness at this time.  H/o heavy periods, but states she is on depo and has not had this problem since starting her depo.

## 2017-02-26 NOTE — Discharge Instructions (Signed)
Follow-up with [facility] Department if any continued vaginal problems. You're test for gonorrhea is positive. He also had bacterial vaginosis and information about this is in your discharge papers. Take all of the Flagyl until completely finished.

## 2017-02-28 ENCOUNTER — Telehealth: Payer: Self-pay | Admitting: Emergency Medicine

## 2017-02-28 NOTE — Telephone Encounter (Addendum)
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  Should get both ceftriaxone and azithromycin as recommended by [facility] communicable disease.  I left a message asking her to call me.   1220--pt called me back. Explained need for additional treatment and rationale.  Gave her number to [facility] std [facility], and she says she will call right now.

## 2017-05-06 ENCOUNTER — Emergency Department (HOSPITAL_BASED_OUTPATIENT_CLINIC_OR_DEPARTMENT_OTHER): Payer: Self-pay

## 2017-05-06 ENCOUNTER — Emergency Department (HOSPITAL_BASED_OUTPATIENT_CLINIC_OR_DEPARTMENT_OTHER)
Admission: EM | Admit: 2017-05-06 | Discharge: 2017-05-06 | Disposition: A | Discharge status: Home / Self Care | Payer: Self-pay | Attending: Emergency Medicine | Admitting: Emergency Medicine

## 2017-05-06 ENCOUNTER — Other Ambulatory Visit: Payer: Self-pay

## 2017-05-06 ENCOUNTER — Encounter (HOSPITAL_BASED_OUTPATIENT_CLINIC_OR_DEPARTMENT_OTHER): Payer: Self-pay | Admitting: *Deleted

## 2017-05-06 DIAGNOSIS — Y999 Unspecified external cause status: Secondary | ICD-10-CM | POA: Insufficient documentation

## 2017-05-06 DIAGNOSIS — M546 Pain in thoracic spine: Secondary | ICD-10-CM | POA: Insufficient documentation

## 2017-05-06 DIAGNOSIS — Y929 Unspecified place or not applicable: Secondary | ICD-10-CM | POA: Insufficient documentation

## 2017-05-06 DIAGNOSIS — Y9389 Activity, other specified: Secondary | ICD-10-CM | POA: Insufficient documentation

## 2017-05-06 DIAGNOSIS — M25572 Pain in left ankle and joints of left foot: Secondary | ICD-10-CM | POA: Insufficient documentation

## 2017-05-06 DIAGNOSIS — R519 Headache, unspecified: Secondary | ICD-10-CM

## 2017-05-06 DIAGNOSIS — R51 Headache: Secondary | ICD-10-CM | POA: Insufficient documentation

## 2017-05-06 DIAGNOSIS — M79632 Pain in left forearm: Secondary | ICD-10-CM | POA: Insufficient documentation

## 2017-05-06 MED ORDER — CYCLOBENZAPRINE HCL 10 MG PO TABS: 10.0000 mg | ORAL_TABLET | Freq: Every evening | ORAL | 0 refills | Status: DC | PRN

## 2017-05-06 NOTE — Discharge Instructions (Signed)
Take Ibuprofen three times daily for the next week. Take this medicine with food. °Take muscle relaxer at bedtime to help you sleep. This medicine makes you drowsy so do not take before driving or work °Use a heating pad for sore muscles - use for 20 minutes several times a day °Return for worsening symptoms ° °
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
## 2017-08-07 ENCOUNTER — Emergency Department
Admission: EM | Admit: 2017-08-07 | Discharge: 2017-08-07 | Discharge status: Against Medical Advice | Payer: 59 | Attending: Emergency Medicine | Admitting: Emergency Medicine

## 2017-08-07 ENCOUNTER — Other Ambulatory Visit: Payer: Self-pay

## 2017-08-07 ENCOUNTER — Encounter: Payer: Self-pay | Admitting: Emergency Medicine

## 2017-08-07 DIAGNOSIS — R51 Headache: Secondary | ICD-10-CM | POA: Diagnosis not present

## 2017-08-07 DIAGNOSIS — R6884 Jaw pain: Secondary | ICD-10-CM | POA: Diagnosis present
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
## 2017-08-07 NOTE — Discharge Instructions (Addendum)
Call your primary care provider for an appointment to be evaluated or return to the emergency department. You may take Tylenol if needed for headache.

## 2017-08-07 NOTE — ED Notes (Signed)
No protocols at this time per Dr. Stafford 
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
## 2017-08-07 NOTE — ED Notes (Signed)
Pt states she has to pick her child up at 1430 and cannot wait any longer. EDP Summers in room speaking to patient. Pt wishes to sign out AMA.

## 2017-08-08 LAB — POCT PREGNANCY, URINE: PREG TEST UR: NEGATIVE

## 2018-06-08 ENCOUNTER — Emergency Department
Admission: EM | Admit: 2018-06-08 | Discharge: 2018-06-08 | Disposition: A | Discharge status: Home / Self Care | Payer: 59 | Attending: Emergency Medicine | Admitting: Emergency Medicine

## 2018-06-08 ENCOUNTER — Other Ambulatory Visit: Payer: Self-pay

## 2018-06-08 ENCOUNTER — Emergency Department: Payer: 59

## 2018-06-08 ENCOUNTER — Encounter: Payer: Self-pay | Admitting: Emergency Medicine

## 2018-06-08 DIAGNOSIS — R300 Dysuria: Secondary | ICD-10-CM | POA: Diagnosis present

## 2018-06-08 DIAGNOSIS — N898 Other specified noninflammatory disorders of vagina: Secondary | ICD-10-CM | POA: Diagnosis not present

## 2018-06-08 DIAGNOSIS — N3000 Acute cystitis without hematuria: Secondary | ICD-10-CM | POA: Diagnosis not present

## 2018-06-08 DIAGNOSIS — R109 Unspecified abdominal pain: Secondary | ICD-10-CM

## 2018-06-08 LAB — URINALYSIS, COMPLETE (UACMP) WITH MICROSCOPIC
BILIRUBIN URINE: NEGATIVE
GLUCOSE, UA: NEGATIVE mg/dL
Ketones, ur: NEGATIVE mg/dL
NITRITE: POSITIVE — AB
PH: 6 (ref 5.0–8.0)
PROTEIN: 100 mg/dL — AB
Specific Gravity, Urine: 1.013 (ref 1.005–1.030)

## 2018-06-08 LAB — CBC
HEMATOCRIT: 39.2 % (ref 36.0–46.0)
HEMOGLOBIN: 12 g/dL (ref 12.0–15.0)
MCH: 25.8 pg — ABNORMAL LOW (ref 26.0–34.0)
MCHC: 30.6 g/dL (ref 30.0–36.0)
MCV: 84.3 fL (ref 80.0–100.0)
Platelets: 209 10*3/uL (ref 150–400)
RBC: 4.65 MIL/uL (ref 3.87–5.11)
RDW: 15 % (ref 11.5–15.5)
WBC: 12.3 10*3/uL — AB (ref 4.0–10.5)
nRBC: 0 % (ref 0.0–0.2)

## 2018-06-08 LAB — BASIC METABOLIC PANEL
Anion gap: 9 (ref 5–15)
BUN: 11 mg/dL (ref 6–20)
CO2: 26 mmol/L (ref 22–32)
CREATININE: 0.88 mg/dL (ref 0.44–1.00)
Calcium: 9.1 mg/dL (ref 8.9–10.3)
Chloride: 101 mmol/L (ref 98–111)
GFR calc Af Amer: >60 mL/min (ref >60)
GFR calc non Af Amer: >60 mL/min (ref >60)
GLUCOSE: 103 mg/dL — AB (ref 70–99)
Potassium: 3.8 mmol/L (ref 3.5–5.1)
Sodium: 136 mmol/L (ref 135–145)

## 2018-06-08 LAB — POCT PREGNANCY, URINE: Preg Test, Ur: NEGATIVE

## 2018-06-08 MED ORDER — ACETAMINOPHEN 500 MG PO TABS
1000.0000 mg | ORAL_TABLET | Freq: Once | ORAL | Status: AC
Start: 2018-06-08 — End: 2018-06-08
  Administered 2018-06-08: 1000 mg via ORAL
  Filled 2018-06-08: qty 2

## 2018-06-08 MED ORDER — ONDANSETRON HCL 4 MG/2ML IJ SOLN
4.0000 mg | Freq: Once | INTRAMUSCULAR | Status: AC
  Administered 2018-06-08: 4 mg via INTRAVENOUS

## 2018-06-08 MED ORDER — ONDANSETRON 4 MG PO TBDP: 4.0000 mg | ORAL_TABLET | Freq: Three times a day (TID) | ORAL | 0 refills | Status: DC | PRN

## 2018-06-08 MED ORDER — SODIUM CHLORIDE 0.9 % IV SOLN
1000.0000 mL | Freq: Once | INTRAVENOUS | Status: AC
  Administered 2018-06-08: 1000 mL via INTRAVENOUS

## 2018-06-08 MED ORDER — CEFTRIAXONE SODIUM 1 G IJ SOLR
1.0000 g | Freq: Once | INTRAMUSCULAR | Status: AC
  Administered 2018-06-08: 1 g via INTRAVENOUS
  Filled 2018-06-08: qty 10

## 2018-06-08 MED ORDER — KETOROLAC TROMETHAMINE 10 MG PO TABS: 10.0000 mg | ORAL_TABLET | Freq: Three times a day (TID) | ORAL | 0 refills | Status: DC

## 2018-06-08 MED ORDER — CEPHALEXIN 500 MG PO CAPS: 500.0000 mg | ORAL_CAPSULE | Freq: Three times a day (TID) | ORAL | 0 refills | Status: DC

## 2018-06-08 MED ORDER — KETOROLAC TROMETHAMINE 30 MG/ML IJ SOLN
30.0000 mg | Freq: Once | INTRAMUSCULAR | Status: AC
  Administered 2018-06-08: 30 mg via INTRAVENOUS
  Filled 2018-06-08: qty 1

## 2018-06-08 NOTE — ED Provider Notes (Signed)
[facility] Emergency Department Provider Note ____________________________________________  Time seen: 1721  I have reviewed the triage vital signs and the nursing notes.  HISTORY  Chief Complaint  Flank Pain  HPI Andrea Chang is a 23 y.o. female presents to the ED from [facility] for evaluation of a 10-day complaint of dysuria and flank pain. She initially reported to the urgent care last week for evaluation. After her urine sample was collected, she was told she would be notified of the results. She was not treated and awaited results. She return today for ongoing symptoms. She was referred to the ED for further management symptomatic dysuria and positive UA. She was unaware of fevers until presenting here. She denies nausea, vomiting, or diarrhea.   Past Medical History:  Diagnosis Date  . Bartholin cyst   . Chlamydia 2013  . Ectopic pregnancy   . Menometrorrhagia   . Missed abortion   . Normal cardiac stress test   . Sepsis (HCC)   . Vaginal discharge     Patient Active Problem List   Diagnosis Date Noted  . Pyelonephritis 11/25/2015  . Sepsis (HCC) 11/25/2015    Past Surgical History:  Procedure Laterality Date  . CESAREAN SECTION    . DIAGNOSTIC LAPAROSCOPY    . DILATION AND CURETTAGE, DIAGNOSTIC / THERAPEUTIC    . ECTOPIC PREGNANCY SURGERY      Prior to Admission medications   Not on File    Allergies Patient has no known allergies.  Family History  Problem Relation Age of Onset  . Hypertension Mother   . Hypertension Father   . Colon cancer Maternal Grandfather     Social History Social History   Tobacco Use  . Smoking status: Never Smoker  . Smokeless tobacco: Never Used  Substance Use Topics  . Alcohol use: No  . Drug use: No    Review of Systems  Constitutional: Negative for fever. Eyes: Negative for visual changes. ENT: Negative for sore throat. Cardiovascular: Negative for chest pain. Respiratory:  Negative for shortness of breath. Gastrointestinal: Negative for abdominal pain, vomiting and diarrhea. Reports flank pain as above Genitourinary: Positive for dysuria. Musculoskeletal: Negative for back pain. Skin: Negative for rash. Neurological: Negative for headaches, focal weakness or numbness. ____________________________________________  PHYSICAL EXAM:  VITAL SIGNS: ED Triage Vitals  Enc Vitals Group     BP 06/08/18 1559 115/81     Pulse Rate 06/08/18 1559 (!) 122     Resp 06/08/18 1559 18     Temp 06/08/18 1559 98.6 F (37 C)     Temp Source 06/08/18 1559 Oral     SpO2 06/08/18 1559 100 %     Weight 06/08/18 1600 182 lb (82.6 kg)     Height 06/08/18 1600 5\' 6"  (1.676 m)     Head Circumference --      Peak Flow --      Pain Score 06/08/18 1600 10     Pain Loc --      Pain Edu? --      Excl. in GC? --     Constitutional: Alert and oriented. Well appearing and in no distress. Head: Normocephalic and atraumatic. Eyes: Conjunctivae are normal. Normal extraocular movements Cardiovascular: Normal rate, regular rhythm. Normal distal pulses. Respiratory: Normal respiratory effort. No wheezes/rales/rhonchi. Gastrointestinal: Soft and nontender. No distention. Normal bowel sounds noted. Positive right flank tenderness on palpation.  ____________________________________________   LABS (pertinent positives/negatives) Labs Reviewed  URINALYSIS, COMPLETE (UACMP) WITH MICROSCOPIC -  Abnormal; Notable for the following components:      Result Value   Color, Urine YELLOW (*)    APPearance CLOUDY (*)    Hgb urine dipstick MODERATE (*)    Protein, ur 100 (*)    Nitrite POSITIVE (*)    Leukocytes, UA LARGE (*)    WBC, UA >50 (*)    Bacteria, UA FEW (*)    All other components within normal limits  BASIC METABOLIC PANEL - Abnormal; Notable for the following components:   Glucose, Bld 103 (*)    All other components within normal limits  CBC - Abnormal; Notable for the following  components:   WBC 12.3 (*)    MCH 25.8 (*)    All other components within normal limits  URINE CULTURE  POC URINE PREG, ED  POCT PREGNANCY, URINE  ____________________________________________  PROCEDURES  Procedures Tylenol 1 g PO Rocephin 1 g IVP Zofran 4 mg IVP NS 1000 ml IV Toradol 30 mg IVP ____________________________________________  INITIAL IMPRESSION / ASSESSMENT AND PLAN / ED COURSE  Patient with ED evaluation of a 10-day complaint of flank pain and abdominal pain.  Patient's clinical picture is consistent with a complicated UTI but without any signs of hematuria or pyelonephritis.  Patient is reassured by her negative CT scan.  Urine culture is pending at the time of discharge.  Patient will be discharged with a prescription for Keflex to take 3 times daily for the next 7 days.  She is also given a prescription for Zofran to take as needed for nausea and vomiting.  Return precautions have been reviewed with the patient, she verbalizes understanding, and is discharged to her own care. ____________________________________________  FINAL CLINICAL IMPRESSION(S) / ED DIAGNOSES  Final diagnoses:  Acute cystitis without hematuria  Flank pain      Romello Hoehn, Charlesetta Ivory, PA-C 06/08/18 2050    Myrna Blazer, MD 06/09/18 2050

## 2018-06-08 NOTE — Discharge Instructions (Addendum)
Your exam, labs, and CT scan are consistent with a complicated UTI. You have been started on an IV antibiotic, and will continue with a pill. Take the prescription meds as directed. Drink plenty of fluids and empty your bladder regularly. Return to the ED immediately, for worsening symptoms, including bleeding, abdominal pain, back pain, or the inability to pee.

## 2018-06-08 NOTE — ED Notes (Signed)
Reference triage note. Pt c/o right sided lower back pain. Pt denies any hx of kidney stones. Pt c/o 10/10 pain at this time. Pt denies vomiting, but c/o nausea at this time. Mother at bedside with pt.

## 2018-06-08 NOTE — ED Triage Notes (Signed)
Pt via pov from [facility]. States she has had flank pain x 10 days and was diagnosed with uti and sent here for further evaluation from [facility]. Pt alert & oriented with NAD noted.

## 2018-06-08 NOTE — ED Notes (Signed)
Pt verbalized understanding of d/c instructions, rx, and f/u care. No further questions at this time. Pt ambulatory to the exit with steady gait.  

## 2018-06-11 LAB — URINE CULTURE
Culture: >=100000 — AB
Special Requests: NORMAL

## 2019-11-23 ENCOUNTER — Other Ambulatory Visit: Payer: Self-pay

## 2019-11-23 ENCOUNTER — Emergency Department
Admission: EM | Admit: 2019-11-23 | Discharge: 2019-11-23 | Disposition: A | Discharge status: Home / Self Care | Payer: BC Managed Care – PPO | Attending: Emergency Medicine | Admitting: Emergency Medicine

## 2019-11-23 ENCOUNTER — Emergency Department: Payer: BC Managed Care – PPO

## 2019-11-23 DIAGNOSIS — N12 Tubulo-interstitial nephritis, not specified as acute or chronic: Secondary | ICD-10-CM | POA: Diagnosis not present

## 2019-11-23 DIAGNOSIS — R109 Unspecified abdominal pain: Secondary | ICD-10-CM | POA: Diagnosis present

## 2019-11-23 HISTORY — DX: Disorder of kidney and ureter, unspecified: N28.9

## 2019-11-23 HISTORY — DX: Iron deficiency anemia, unspecified: D50.9

## 2019-11-23 LAB — COMPREHENSIVE METABOLIC PANEL
ALT: 14 U/L (ref 0–44)
AST: 20 U/L (ref 15–41)
Albumin: 4.3 g/dL (ref 3.5–5.0)
Alkaline Phosphatase: 47 U/L (ref 38–126)
Anion gap: 10 (ref 5–15)
BUN: 9 mg/dL (ref 6–20)
CO2: 24 mmol/L (ref 22–32)
Calcium: 9.3 mg/dL (ref 8.9–10.3)
Chloride: 102 mmol/L (ref 98–111)
Creatinine, Ser: 0.66 mg/dL (ref 0.44–1.00)
GFR calc Af Amer: >60 mL/min (ref >60)
GFR calc non Af Amer: >60 mL/min (ref >60)
Glucose, Bld: 84 mg/dL (ref 70–99)
Potassium: 3.8 mmol/L (ref 3.5–5.1)
Sodium: 136 mmol/L (ref 135–145)
Total Bilirubin: 0.9 mg/dL (ref 0.3–1.2)
Total Protein: 8.3 g/dL — ABNORMAL HIGH (ref 6.5–8.1)

## 2019-11-23 LAB — PROTIME-INR
INR: 1.1 (ref 0.8–1.2)
Prothrombin Time: 13.3 seconds (ref 11.4–15.2)

## 2019-11-23 LAB — URINALYSIS, COMPLETE (UACMP) WITH MICROSCOPIC
Bilirubin Urine: NEGATIVE
Glucose, UA: NEGATIVE mg/dL
Ketones, ur: 80 mg/dL — AB
Nitrite: NEGATIVE
Protein, ur: NEGATIVE mg/dL
Specific Gravity, Urine: 1.019 (ref 1.005–1.030)
pH: 6 (ref 5.0–8.0)

## 2019-11-23 LAB — CBC WITH DIFFERENTIAL/PLATELET
Abs Immature Granulocytes: 0.03 10*3/uL (ref 0.00–0.07)
Basophils Absolute: 0 10*3/uL (ref 0.0–0.1)
Basophils Relative: 0 %
Eosinophils Absolute: 0 10*3/uL (ref 0.0–0.5)
Eosinophils Relative: 0 %
HCT: 36.2 % (ref 36.0–46.0)
Hemoglobin: 11.2 g/dL — ABNORMAL LOW (ref 12.0–15.0)
Immature Granulocytes: 0 %
Lymphocytes Relative: 8 %
Lymphs Abs: 1 10*3/uL (ref 0.7–4.0)
MCH: 24.9 pg — ABNORMAL LOW (ref 26.0–34.0)
MCHC: 30.9 g/dL (ref 30.0–36.0)
MCV: 80.4 fL (ref 80.0–100.0)
Monocytes Absolute: 0.9 10*3/uL (ref 0.1–1.0)
Monocytes Relative: 8 %
Neutro Abs: 9.9 10*3/uL — ABNORMAL HIGH (ref 1.7–7.7)
Neutrophils Relative %: 84 %
Platelets: 262 10*3/uL (ref 150–400)
RBC: 4.5 MIL/uL (ref 3.87–5.11)
RDW: 17.2 % — ABNORMAL HIGH (ref 11.5–15.5)
WBC: 11.8 10*3/uL — ABNORMAL HIGH (ref 4.0–10.5)
nRBC: 0 % (ref 0.0–0.2)

## 2019-11-23 LAB — PREGNANCY, URINE: Preg Test, Ur: NEGATIVE

## 2019-11-23 LAB — LACTIC ACID, PLASMA: Lactic Acid, Venous: 1.4 mmol/L (ref 0.5–1.9)

## 2019-11-23 LAB — POCT PREGNANCY, URINE: Preg Test, Ur: NEGATIVE

## 2019-11-23 MED ORDER — CIPROFLOXACIN HCL 500 MG PO TABS: 500.0000 mg | ORAL_TABLET | Freq: Two times a day (BID) | ORAL | 0 refills | Status: AC

## 2019-11-23 MED ORDER — SODIUM CHLORIDE 0.9 % IV SOLN: Freq: Once | INTRAVENOUS | Status: AC

## 2019-11-23 MED ORDER — ACETAMINOPHEN 325 MG PO TABS
650.0000 mg | ORAL_TABLET | Freq: Once | ORAL | Status: AC
  Administered 2019-11-23: 650 mg via ORAL
  Filled 2019-11-23: qty 2

## 2019-11-23 MED ORDER — FLUCONAZOLE 150 MG PO TABS: 150.0000 mg | ORAL_TABLET | Freq: Every day | ORAL | 1 refills | Status: DC

## 2019-11-23 MED ORDER — SODIUM CHLORIDE 0.9 % IV SOLN
1.0000 g | Freq: Once | INTRAVENOUS | Status: AC
  Administered 2019-11-23: 1 g via INTRAVENOUS
  Filled 2019-11-23: qty 10

## 2019-11-23 MED ORDER — KETOROLAC TROMETHAMINE 30 MG/ML IJ SOLN
30.0000 mg | Freq: Once | INTRAMUSCULAR | Status: AC
  Administered 2019-11-23: 30 mg via INTRAVENOUS
  Filled 2019-11-23: qty 1

## 2019-11-23 MED ORDER — MORPHINE SULFATE (PF) 4 MG/ML IV SOLN: 4.0000 mg | Freq: Once | INTRAVENOUS | Status: DC

## 2019-11-23 NOTE — ED Triage Notes (Signed)
Patient reports left side back pain, generalized body aches, and fever since yesterday. History of kidney stones.

## 2019-11-23 NOTE — ED Notes (Signed)
Pt taken to CT.

## 2019-11-23 NOTE — ED Provider Notes (Signed)
ER Provider Note       Time seen: 9:06 PM    I have reviewed the vital signs and the nursing notes.  HISTORY   Chief Complaint Fever, Generalized Body Aches, and Flank Pain    HPI Andrea Chang is a 24 y.o. female with a history of renal disorder, sepsis who presents today for left-sided back pain with generalized body aches and fever since yesterday.  Reportedly she has history of pyelonephritis, discomfort is 8 out of 10.  She denies nausea or vomiting, reports she had pyelonephritis 1 year ago.  Past Medical History:  Diagnosis Date  . Bartholin cyst   . Chlamydia 2013  . Ectopic pregnancy   . Iron deficiency anemia   . Menometrorrhagia   . Missed abortion   . Normal cardiac stress test   . Renal disorder   . Sepsis ([place])   . Vaginal discharge     Past Surgical History:  Procedure Laterality Date  . CESAREAN SECTION    . DIAGNOSTIC LAPAROSCOPY    . DILATION AND CURETTAGE, DIAGNOSTIC / THERAPEUTIC    . ECTOPIC PREGNANCY SURGERY      Allergies Patient has no known allergies.  Review of Systems Constitutional: Positive for fever and chills Cardiovascular: Negative for chest pain. Respiratory: Negative for shortness of breath. Gastrointestinal: Negative for abdominal pain, vomiting and diarrhea. Musculoskeletal: Positive for back pain Skin: Negative for rash. Neurological: Negative for headaches, focal weakness or numbness.  All systems negative/normal/unremarkable except as stated in the HPI  ____________________________________________   PHYSICAL EXAM:  VITAL SIGNS: Vitals:   11/23/19 1813  BP: 115/67  Pulse: (!) 116  Resp: 17  Temp: (!) 102.9 F (39.4 C)  SpO2: 100%    Constitutional: Alert and oriented.  Mild distress Eyes: Conjunctivae are normal. Normal extraocular movements. ENT      Head: Normocephalic and atraumatic.      Nose: No congestion/rhinnorhea.      Mouth/Throat: Mucous membranes are moist.      Neck: No  stridor. Cardiovascular: Normal rate, regular rhythm. No murmurs, rubs, or gallops. Respiratory: Normal respiratory effort without tachypnea nor retractions. Breath sounds are clear and equal bilaterally. No wheezes/rales/rhonchi. Gastrointestinal: Soft and nontender. Normal bowel sounds Musculoskeletal: Nontender with normal range of motion in extremities.  Left CVA tenderness Neurologic:  Normal speech and language. No gross focal neurologic deficits are appreciated.  Skin:  Skin is warm, dry and intact. No rash noted. Psychiatric: Speech and behavior are normal.  ____________________________________________   LABS (pertinent positives/negatives)  Labs Reviewed  COMPREHENSIVE METABOLIC PANEL - Abnormal; Notable for the following components:      Result Value   Total Protein 8.3 (*)    All other components within normal limits  CBC WITH DIFFERENTIAL/PLATELET - Abnormal; Notable for the following components:   WBC 11.8 (*)    Hemoglobin 11.2 (*)    MCH 24.9 (*)    RDW 17.2 (*)    Neutro Abs 9.9 (*)    All other components within normal limits  URINALYSIS, COMPLETE (UACMP) WITH MICROSCOPIC - Abnormal; Notable for the following components:   Color, Urine YELLOW (*)    APPearance CLOUDY (*)    Hgb urine dipstick SMALL (*)    Ketones, ur 80 (*)    Leukocytes,Ua TRACE (*)    Bacteria, UA RARE (*)    All other components within normal limits  CULTURE, BLOOD (ROUTINE X 2)  CULTURE, BLOOD (ROUTINE X 2)  LACTIC ACID, PLASMA  PROTIME-INR  PREGNANCY, URINE  LACTIC ACID, PLASMA  POC URINE PREG, ED  POCT PREGNANCY, URINE    RADIOLOGY  Images were viewed by me CT renal protocol IMPRESSION: No CT evidence of acute intra-abdominal pathology.  DIFFERENTIAL DIAGNOSIS  Renal colic, UTI, pyelonephritis, sepsis, vaccine reaction  ASSESSMENT AND PLAN  Pyelonephritis   Plan: The patient had presented for fevers, chills and low back pain. Patient's labs did indicate mild  leukocytosis and CT was overall reassuring.  Clinically she likely has pyelonephritis and was given IV Rocephin.  We also sent a urine culture.  She is cleared for outpatient follow-up in 48 hours.  Daryel November MD    Note: This note was generated in part or whole with voice recognition software. Voice recognition is usually quite accurate but there are transcription errors that can and very often do occur. I apologize for any typographical errors that were not detected and corrected.     Emily Filbert, MD 11/23/19 2208

## 2019-11-26 LAB — URINE CULTURE: Culture: >=100000 — AB

## 2019-11-28 LAB — CULTURE, BLOOD (ROUTINE X 2)
Culture: NO GROWTH
Culture: NO GROWTH

## 2020-04-13 ENCOUNTER — Emergency Department
Admission: EM | Admit: 2020-04-13 | Discharge: 2020-04-13 | Disposition: A | Discharge status: Home / Self Care | Payer: BC Managed Care – PPO | Attending: Emergency Medicine | Admitting: Emergency Medicine

## 2020-04-13 ENCOUNTER — Other Ambulatory Visit: Payer: Self-pay

## 2020-04-13 ENCOUNTER — Encounter: Payer: Self-pay | Admitting: Emergency Medicine

## 2020-04-13 DIAGNOSIS — Z3202 Encounter for pregnancy test, result negative: Secondary | ICD-10-CM

## 2020-04-13 DIAGNOSIS — Z3200 Encounter for pregnancy test, result unknown: Secondary | ICD-10-CM | POA: Diagnosis present

## 2020-04-13 LAB — POC URINE PREG, ED: Preg Test, Ur: NEGATIVE

## 2020-04-13 LAB — HCG, QUANTITATIVE, PREGNANCY: hCG, Beta Chain, Quant, S: 1 m[IU]/mL (ref <5)

## 2020-04-13 NOTE — ED Triage Notes (Signed)
Pt continues to talk on the phone during triage. Pt states her last menstrual cycle was on November 1st so she needs something other than a urine test.

## 2020-04-13 NOTE — ED Provider Notes (Signed)
[facility] Emergency Department Provider Note  ____________________________________________  Time seen: Approximately 10:55 AM  I have reviewed the triage vital signs and the nursing notes.   HISTORY  Chief Complaint Possible Pregnancy    HPI Andrea Chang is a 24 y.o. female that presents to the emergency department requesting a pregnancy test.  Patient states that she was in court yesterday and started to feel bad and vomited and her friend recommended that she do a pregnancy test.  She bought 2 pregnancy test from [employer] and both had a faint line on them.  She went to urgent care following this for a confirmation pregnancy test and their pregnancy test was negative.  She also tested negative for COVID-19.  Patient presents today only requesting a pregnancy test.  She has an appointment with gynecology in December but does not want to wait this long.  Last menstrual period was November 1.  Past Medical History:  Diagnosis Date  . Bartholin cyst   . Chlamydia 2013  . Ectopic pregnancy   . Iron deficiency anemia   . Menometrorrhagia   . Missed abortion   . Normal cardiac stress test   . Renal disorder   . Sepsis (HCC)   . Vaginal discharge     Patient Active Problem List   Diagnosis Date Noted  . Pyelonephritis 11/25/2015  . Sepsis (HCC) 11/25/2015    Past Surgical History:  Procedure Laterality Date  . CESAREAN SECTION    . DIAGNOSTIC LAPAROSCOPY    . DILATION AND CURETTAGE, DIAGNOSTIC / THERAPEUTIC    . ECTOPIC PREGNANCY SURGERY      Prior to Admission medications   Medication Sig Start Date End Date Taking? Authorizing Provider  cephALEXin (KEFLEX) 500 MG capsule Take 1 capsule (500 mg total) by mouth 3 (three) times daily. 06/08/18   Menshew, Charlesetta Ivory, PA-C  fluconazole (DIFLUCAN) 150 MG tablet Take 1 tablet (150 mg total) by mouth daily. 11/23/19   Emily Filbert, MD  ketorolac (TORADOL) 10 MG tablet Take 1 tablet (10 mg  total) by mouth every 8 (eight) hours. 06/08/18   Menshew, Charlesetta Ivory, PA-C  ondansetron (ZOFRAN ODT) 4 MG disintegrating tablet Take 1 tablet (4 mg total) by mouth every 8 (eight) hours as needed. 06/08/18   Menshew, Charlesetta Ivory, PA-C    Allergies Patient has no known allergies.  Family History  Problem Relation Age of Onset  . Hypertension Mother   . Hypertension Father   . Colon cancer Maternal Grandfather     Social History Social History   Tobacco Use  . Smoking status: Never Smoker  . Smokeless tobacco: Never Used  Vaping Use  . Vaping Use: Never used  Substance Use Topics  . Alcohol use: No  . Drug use: No     Review of Systems  Constitutional: No fever/chills Cardiovascular: No chest pain. Respiratory: No SOB. Gastrointestinal: No abdominal pain.   Genitourinary: Negative for dysuria. Musculoskeletal: Negative for musculoskeletal pain. Skin: Negative for rash, abrasions, lacerations, ecchymosis. Neurological: Negative for headaches   ____________________________________________   PHYSICAL EXAM:  VITAL SIGNS: ED Triage Vitals  Enc Vitals Group     BP 04/13/20 0954 116/76     Pulse Rate 04/13/20 0954 100     Resp 04/13/20 0954 20     Temp 04/13/20 0954 99.5 F (37.5 C)     Temp Source 04/13/20 0954 Oral     SpO2 04/13/20 0954 100 %  Weight 04/13/20 0951 185 lb (83.9 kg)     Height 04/13/20 0951 5\' 6"  (1.676 m)     Head Circumference --      Peak Flow --      Pain Score 04/13/20 0951 0     Pain Loc --      Pain Edu? --      Excl. in GC? --      Constitutional: Alert and oriented. Well appearing and in no acute distress. Eyes: Conjunctivae are normal. PERRL. EOMI. Head: Atraumatic. ENT:      Ears:      Nose: No congestion/rhinnorhea.      Mouth/Throat: Mucous membranes are moist.  Neck: No stridor.   Cardiovascular: Normal rate, regular rhythm.  Good peripheral circulation. Respiratory: Normal respiratory effort without tachypnea or  retractions. Lungs CTAB. Good air entry to the bases with no decreased or absent breath sounds. Gastrointestinal: Bowel sounds 4 quadrants. Soft and nontender to palpation. No guarding or rigidity. No palpable masses. No distention.  Musculoskeletal: Full range of motion to all extremities. No gross deformities appreciated. Neurologic:  Normal speech and language. No gross focal neurologic deficits are appreciated.  Skin:  Skin is warm, dry and intact. No rash noted. Psychiatric: Mood and affect are normal. Speech and behavior are normal. Patient exhibits appropriate insight and judgement.   ____________________________________________   LABS (all labs ordered are listed, but only abnormal results are displayed)  Labs Reviewed  HCG, QUANTITATIVE, PREGNANCY  POC URINE PREG, ED   ____________________________________________  EKG   ____________________________________________  RADIOLOGY   No results found.  ____________________________________________    PROCEDURES  Procedure(s) performed:    Procedures    Medications - No data to display   ____________________________________________   INITIAL IMPRESSION / ASSESSMENT AND PLAN / ED COURSE  Pertinent labs & imaging results that were available during my care of the patient were reviewed by me and considered in my medical decision making (see chart for details).  Review of the [place] CSRS was performed in accordance of the [facility] prior to dispensing any controlled drugs.     Patient presents emergency department requesting a pregnancy test.  Vital signs and exam are reassuring.  Urine pregnancy test is negative.  Beta hCG is 1.  Patient does not wish to have any further evaluation at this time. Patient is to follow up with primary care as directed. Patient is given ED precautions to return to the ED for any worsening or new symptoms.  Andrea Chang was evaluated in Emergency Department on 04/13/2020 for the symptoms  described in the history of present illness. She was evaluated in the context of the global COVID-19 pandemic, which necessitated consideration that the patient might be at risk for infection with the SARS-CoV-2 virus that causes COVID-19. Institutional protocols and algorithms that pertain to the evaluation of patients at risk for COVID-19 are in a state of rapid change based on information released by regulatory bodies including the CDC and federal and state organizations. These policies and algorithms were followed during the patient's care in the ED.   ____________________________________________  FINAL CLINICAL IMPRESSION(S) / ED DIAGNOSES  Final diagnoses:  Negative pregnancy test      NEW MEDICATIONS STARTED DURING THIS VISIT:  ED Discharge Orders    None          This chart was dictated using voice recognition software/Dragon. Despite best efforts to proofread, errors can occur which can change the meaning. Any change was purely unintentional.  Enid Derry, PA-C 04/13/20 1544    Sharyn Creamer, MD 04/13/20 1620

## 2020-04-13 NOTE — ED Triage Notes (Signed)
Pt reports was at court yesterday and started feeling weird. Pt reports when she left she vomited and her friend told her she should take a pregnancy test. Pt reports she took 2 and they had faint lines so she went to the [facility] and their test came back negative. Pt reports her GYN cannot see her until December and she wants an [place] now to see if she is pregnant or not. She doesn't want to wait until December.

## 2021-02-28 ENCOUNTER — Emergency Department
Admission: EM | Admit: 2021-02-28 | Discharge: 2021-02-28 | Disposition: A | Discharge status: Home / Self Care | Payer: BC Managed Care – PPO | Attending: Emergency Medicine | Admitting: Emergency Medicine

## 2021-02-28 ENCOUNTER — Other Ambulatory Visit: Payer: Self-pay

## 2021-02-28 DIAGNOSIS — R000 Tachycardia, unspecified: Secondary | ICD-10-CM | POA: Insufficient documentation

## 2021-02-28 DIAGNOSIS — N751 Abscess of Bartholin's gland: Secondary | ICD-10-CM | POA: Diagnosis not present

## 2021-02-28 DIAGNOSIS — N9489 Other specified conditions associated with female genital organs and menstrual cycle: Secondary | ICD-10-CM | POA: Diagnosis present

## 2021-02-28 MED ORDER — CLINDAMYCIN HCL 300 MG PO CAPS: 300.0000 mg | ORAL_CAPSULE | Freq: Three times a day (TID) | ORAL | 0 refills | Status: AC

## 2021-02-28 MED ORDER — CEFIXIME 400 MG PO CAPS: 400.0000 mg | ORAL_CAPSULE | Freq: Every day | ORAL | 0 refills | Status: AC

## 2021-02-28 MED ORDER — HYDROMORPHONE HCL 1 MG/ML IJ SOLN
1.0000 mg | Freq: Once | INTRAMUSCULAR | Status: AC
  Administered 2021-02-28: 1 mg via INTRAMUSCULAR
  Filled 2021-02-28: qty 1

## 2021-02-28 MED ORDER — TRAMADOL HCL 50 MG PO TABS
50.0000 mg | ORAL_TABLET | Freq: Four times a day (QID) | ORAL | 0 refills | Status: AC | PRN
Start: 2021-02-28 — End: 2022-02-28

## 2021-02-28 MED ORDER — LIDOCAINE HCL (PF) 1 % IJ SOLN: 5.0000 mL | Freq: Once | INTRAMUSCULAR | Status: AC

## 2021-02-28 MED ORDER — LIDOCAINE HCL (PF) 1 % IJ SOLN
INTRAMUSCULAR | Status: AC
  Administered 2021-02-28: 5 mL
  Filled 2021-02-28: qty 10

## 2021-02-28 NOTE — ED Notes (Signed)
See triage note presents with possible abscess   states she noticed area to vaginal area couple of days ago

## 2021-02-28 NOTE — ED Triage Notes (Signed)
Pt to ED for cyst to vaginal area since Sunday, states was seen at [facility] and prescribed doxycycline .

## 2021-02-28 NOTE — Discharge Instructions (Addendum)
Stop taking the doxycycline, and start taking the cefixime and clindamycin as prescribed for the next week.  You may take the tramadol as needed for pain.  Follow-up with your gynecologist at [facility] in the next several days or early next week at the latest.  You have a small catheter or tube in place in your vagina called a "Word catheter" which is allowing the pus to continue to drain but this will need to come out in the next several days.  Return to the ER for any new, worsening, or persistent severe swelling, worsening pain, fever or chills, vomiting or inability to tolerate the antibiotics, or any other new or worsening symptoms that concern you.

## 2021-02-28 NOTE — ED Provider Notes (Signed)
[facility] Emergency Department Provider Note ____________________________________________   Event Date/Time   First MD Initiated Contact with Patient 02/28/21 [phone]     (approximate)  I have reviewed the triage vital signs and the nursing notes.   HISTORY  Chief Complaint Abscess    HPI Andrea Chang is a 25 y.o. female with PMH as noted below who presents with swelling and pain to her vaginal area over the last 2 to 3 days, gradual onset, worsening course, and associated last night with some subjective chills and body aches.  The patient states that she was seen at urgent care yesterday and told she had a cyst.  She was started on doxycycline and has taken 1 dose with no improvement in her symptoms.  She reports pain in the right labia which radiates up to her groin and back towards her buttocks.  She denies any vaginal discharge or bleeding.  She has no urinary symptoms.  Past Medical History:  Diagnosis Date   Bartholin cyst    Chlamydia 2013   Ectopic pregnancy    Iron deficiency anemia    Menometrorrhagia    Missed abortion    Normal cardiac stress test    Renal disorder    Sepsis [facility])    Vaginal discharge     Patient Active Problem List   Diagnosis Date Noted   Pyelonephritis 11/25/2015   Sepsis (HCC) 11/25/2015    Past Surgical History:  Procedure Laterality Date   CESAREAN SECTION     DIAGNOSTIC LAPAROSCOPY     DILATION AND CURETTAGE, DIAGNOSTIC / THERAPEUTIC     ECTOPIC PREGNANCY SURGERY      Prior to Admission medications   Medication Sig Start Date End Date Taking? Authorizing Provider  cefixime (SUPRAX) 400 MG CAPS capsule Take 1 capsule (400 mg total) by mouth daily for 7 days. 02/28/21 03/07/21 Yes Dionne Bucy, MD  clindamycin (CLEOCIN) 300 MG capsule Take 1 capsule (300 mg total) by mouth 3 (three) times daily for 7 days. 02/28/21 03/07/21 Yes Dionne Bucy, MD  traMADol (ULTRAM) 50 MG tablet Take 1 tablet (50  mg total) by mouth every 6 (six) hours as needed. 02/28/21 02/28/22 Yes Dionne Bucy, MD    Allergies Patient has no known allergies.  Family History  Problem Relation Age of Onset   Hypertension Mother    Hypertension Father    Colon cancer Maternal Grandfather     Social History Social History   Tobacco Use   Smoking status: Never   Smokeless tobacco: Never  Vaping Use   Vaping Use: Never used  Substance Use Topics   Alcohol use: No   Drug use: No    Review of Systems  Constitutional: Positive for chills. Eyes: No visual changes. ENT: No sore throat. Cardiovascular: Denies chest pain. Respiratory: Denies shortness of breath. Gastrointestinal: No vomiting or diarrhea.  Genitourinary: Negative for dysuria.  Positive for labial swelling. Musculoskeletal: Negative for back pain. Skin: Negative for rash. Neurological: Negative for headaches, focal weakness or numbness.   ____________________________________________   PHYSICAL EXAM:  VITAL SIGNS: ED Triage Vitals  Enc Vitals Group     BP 02/28/21 0712 122/86     Pulse Rate 02/28/21 0712 (!) 101     Resp 02/28/21 0712 20     Temp 02/28/21 0712 99.1 F (37.3 C)     Temp Source 02/28/21 0712 Oral     SpO2 02/28/21 0712 99 %     Weight 02/28/21 0710 210 lb (  95.3 kg)     Height 02/28/21 0710 5\' 7"  (1.702 m)     Head Circumference --      Peak Flow --      Pain Score 02/28/21 0710 10     Pain Loc --      Pain Edu? --      Excl. in GC? --     Constitutional: Alert and oriented.  Well appearing, in no acute distress. Eyes: Conjunctivae are normal.  Head: Atraumatic. Nose: No congestion/rhinnorhea. Mouth/Throat: Mucous membranes are moist.   Neck: Normal range of motion.  Cardiovascular: Normal rate, regular rhythm. Good peripheral circulation. Respiratory: Normal respiratory effort.  No retractions.  Gastrointestinal: Soft and nontender. No distention.  Genitourinary: Approximately 6 cm fluctuant  mass in the right introitus, markedly tender to palpation.  No significant surrounding erythema.  Mild right inguinal lymphadenopathy.  No erythema, induration, or fluctuance to the perineum or perianal areas. Musculoskeletal: No lower extremity edema.  Extremities warm and well perfused.  Neurologic:  Normal speech and language. No gross focal neurologic deficits are appreciated.  Skin:  Skin is warm and dry. No rash noted. Psychiatric: Mood and affect are normal. Speech and behavior are normal.  ____________________________________________   LABS (all labs ordered are listed, but only abnormal results are displayed)  Labs Reviewed - No data to display ____________________________________________  EKG   ____________________________________________  RADIOLOGY    ____________________________________________   PROCEDURES  Procedure(s) performed: Yes  .09/30/22Incision and Drainage  Date/Time: 02/28/2021 10:21 AM Performed by: 03/02/2021, MD Authorized by: Dionne Bucy, MD   Consent:    Consent obtained:  Verbal   Consent given by:  Patient   Risks, benefits, and alternatives were discussed: yes     Risks discussed:  Bleeding, incomplete drainage and infection   Alternatives discussed:  No treatment, delayed treatment and observation Universal protocol:    Patient identity confirmed:  Verbally with patient Location:    Type:  Bartholin cyst   Size:  6cm   Location:  Anogenital   Anogenital location:  Bartholin's gland Pre-procedure details:    Skin preparation:  Povidone-iodine Sedation:    Sedation type:  None Anesthesia:    Anesthesia method:  Local infiltration   Local anesthetic:  Lidocaine 1% w/o epi Procedure type:    Complexity:  Complex Procedure details:    Incision types:  Stab incision   Wound management:  Probed and deloculated   Drainage:  Purulent   Drainage amount:  Copious   Wound treatment:  Drain placed   Packing materials:  Word  catheter Post-procedure details:    Procedure completion:  Tolerated well, no immediate complications  Critical Care performed: No ____________________________________________   INITIAL IMPRESSION / ASSESSMENT AND PLAN / ED COURSE  Pertinent labs & imaging results that were available during my care of the patient were reviewed by me and considered in my medical decision making (see chart for details).   25 year old female with PMH as noted above presents with right labial area swelling and pain over the last several days.  She was started on doxycycline yesterday but has only had 1 dose.  She reports some subjective chills.  On exam her vital signs are normal except for borderline tachycardia and borderline elevated temperature.  GU exam reveals a large fluctuant mass to the right labia consistent with a Bartholin's gland abscess.  There are some mild right inguinal lymphadenopathy but there is no fluctuance or induration to the skin or soft tissue of the  groin, external labia, perineum, or perianal areas.  The patient received analgesia.  I then performed an I&D as documented above and placed a Word catheter.  There was copious return of pus and the patient had immediate significant relief.  We will discontinue doxycycline and start the patient on cefixime and clindamycin.  I gave her referral to follow-up with her OB/GYN at the [place] [facility] within the next several days.  At this time, there is no evidence of deep soft tissue infection, sepsis, or other acute complication.  Given the successful I&D, the patient is stable for discharge home.  I counseled her extensively on the plan of care and on return precautions, and she expressed understanding.  ____________________________________________   FINAL CLINICAL IMPRESSION(S) / ED DIAGNOSES  Final diagnoses:  Bartholin's gland abscess      NEW MEDICATIONS STARTED DURING THIS VISIT:  Discharge Medication List as of 02/28/2021  9:52  AM     START taking these medications   Details  cefixime (SUPRAX) 400 MG CAPS capsule Take 1 capsule (400 mg total) by mouth daily for 7 days., Starting Wed 02/28/2021, Until Wed 03/07/2021, Normal    clindamycin (CLEOCIN) 300 MG capsule Take 1 capsule (300 mg total) by mouth 3 (three) times daily for 7 days., Starting Wed 02/28/2021, Until Wed 03/07/2021, Normal    traMADol (ULTRAM) 50 MG tablet Take 1 tablet (50 mg total) by mouth every 6 (six) hours as needed., Starting Wed 02/28/2021, Until Thu 02/28/2022 at 2359, Normal         Note:  This document was prepared using Dragon voice recognition software and may include unintentional dictation errors.    Dionne Bucy, MD 02/28/21 1026

## 2021-02-28 NOTE — ED Notes (Signed)
Pt assisted to the bathroom to urinate. Pt ambulatory back to rm with this tech at side. Pt has no further needs at this time. Call light within reach. Family at bedside.

## 2021-03-04 ENCOUNTER — Other Ambulatory Visit: Payer: Self-pay

## 2021-03-04 ENCOUNTER — Emergency Department
Admission: EM | Admit: 2021-03-04 | Discharge: 2021-03-04 | Disposition: A | Discharge status: Home / Self Care | Payer: BC Managed Care – PPO | Attending: Emergency Medicine | Admitting: Emergency Medicine

## 2021-03-04 DIAGNOSIS — Z4800 Encounter for change or removal of nonsurgical wound dressing: Secondary | ICD-10-CM | POA: Diagnosis present

## 2021-03-04 DIAGNOSIS — Z5189 Encounter for other specified aftercare: Secondary | ICD-10-CM

## 2021-03-04 NOTE — ED Notes (Signed)
Patient given discharge instructions, all questions answered. Patient in possession of all belongings, directed to the discharge area  

## 2021-03-04 NOTE — Discharge Instructions (Addendum)
Follow-up with your OB/GYN on 10/5 as planned.  Return to the ER for any new, worsening, or persistent pain, bleeding, pus drainage, swelling, redness, fever or chills, or any other new or worsening symptoms that concern you.

## 2021-03-04 NOTE — ED Provider Notes (Signed)
[facility] Emergency Department Provider Note ____________________________________________   Event Date/Time   First MD Initiated Contact with Patient 03/04/21 1116     (approximate)  I have reviewed the triage vital signs and the nursing notes.   HISTORY  Chief Complaint Wound Check    HPI Andrea Chang is a 25 y.o. female with PMH as noted below who presents for a wound recheck after she had a Bartholin's abscess incised and drained on 9/28 and a Word catheter placed.  The patient states that she has been taking antibiotics as prescribed.  She reports that the pain and swelling have gone down significantly although she still has some discomfort from the Word catheter.  There has been a small amount of continued drainage from the catheter.  She denies any fever or chills.  Past Medical History:  Diagnosis Date   Bartholin cyst    Chlamydia 2013   Ectopic pregnancy    Iron deficiency anemia    Menometrorrhagia    Missed abortion    Normal cardiac stress test    Renal disorder    Sepsis [facility])    Vaginal discharge     Patient Active Problem List   Diagnosis Date Noted   Pyelonephritis 11/25/2015   Sepsis (HCC) 11/25/2015    Past Surgical History:  Procedure Laterality Date   CESAREAN SECTION     DIAGNOSTIC LAPAROSCOPY     DILATION AND CURETTAGE, DIAGNOSTIC / THERAPEUTIC     ECTOPIC PREGNANCY SURGERY      Prior to Admission medications   Medication Sig Start Date End Date Taking? Authorizing Provider  cefixime (SUPRAX) 400 MG CAPS capsule Take 1 capsule (400 mg total) by mouth daily for 7 days. 02/28/21 03/07/21  Dionne Bucy, MD  clindamycin (CLEOCIN) 300 MG capsule Take 1 capsule (300 mg total) by mouth 3 (three) times daily for 7 days. 02/28/21 03/07/21  Dionne Bucy, MD  traMADol (ULTRAM) 50 MG tablet Take 1 tablet (50 mg total) by mouth every 6 (six) hours as needed. 02/28/21 02/28/22  Dionne Bucy, MD     Allergies Patient has no known allergies.  Family History  Problem Relation Age of Onset   Hypertension Mother    Hypertension Father    Colon cancer Maternal Grandfather     Social History Social History   Tobacco Use   Smoking status: Never   Smokeless tobacco: Never  Vaping Use   Vaping Use: Never used  Substance Use Topics   Alcohol use: No   Drug use: No    Review of Systems  Constitutional: No fever/chills Eyes: No visual changes. ENT: No sore throat. Cardiovascular: Denies chest pain. Respiratory: Denies shortness of breath. Gastrointestinal: No nausea, no vomiting.  No diarrhea.  Genitourinary: Negative for dysuria.  Musculoskeletal: Negative for back pain. Skin: Negative for rash. Neurological: Negative for headaches, focal weakness or numbness.   ____________________________________________   PHYSICAL EXAM:  VITAL SIGNS: ED Triage Vitals  Enc Vitals Group     BP 03/04/21 1104 127/84     Pulse Rate 03/04/21 1104 88     Resp 03/04/21 1104 18     Temp 03/04/21 1104 98 F (36.7 C)     Temp src --      SpO2 03/04/21 1104 100 %     Weight --      Height --      Head Circumference --      Peak Flow --      Pain Score  03/04/21 1103 4     Pain Loc --      Pain Edu? --      Excl. in GC? --     Constitutional: Alert and oriented. Well appearing and in no acute distress. Eyes: Conjunctivae are normal.  Head: Atraumatic. Nose: No congestion/rhinnorhea. Mouth/Throat: Mucous membranes are moist.   Neck: Normal range of motion.  Cardiovascular: Good peripheral circulation. Respiratory: Normal respiratory effort.   Gastrointestinal: No distention.  Genitourinary: Normal external genitalia.  5 mm incision to right internal labia clean, dry, intact, with Word catheter partially dislodged.  No surrounding erythema, induration, or abnormal warmth.  No fluctuance.  No inguinal lymphadenopathy. Musculoskeletal: No lower extremity edema.  Extremities  warm and well perfused.  Neurologic:  Normal speech and language. No gross focal neurologic deficits are appreciated.  Skin:  Skin is warm and dry. No rash noted. Psychiatric: Mood and affect are normal. Speech and behavior are normal.  ____________________________________________   LABS (all labs ordered are listed, but only abnormal results are displayed)  Labs Reviewed - No data to display ____________________________________________  EKG   ____________________________________________  RADIOLOGY    ____________________________________________   PROCEDURES  Procedure(s) performed: No  Procedures  Critical Care performed: No ____________________________________________   INITIAL IMPRESSION / ASSESSMENT AND PLAN / ED COURSE  Pertinent labs & imaging results that were available during my care of the patient were reviewed by me and considered in my medical decision making (see chart for details).   25 year old female with PMH as noted above presents for a wound recheck after she had a Bartholin's gland abscess drained on 9/28 and a Word catheter placed.  She was started on antibiotics and states that she has been compliant.  I evaluated the patient on 9/28 and performed the procedure.  On reassessment currently, the patient is well-appearing and her vital signs are normal.  She has not had any systemic symptoms.  On reexamination, the Word catheter is dislodged, with the balloon sitting in the vagina just outside the incision.  I therefore removed the Word catheter from the vagina without having to deflate the balloon.  Compared to the patient's exam on 9/28, there is no fluctuance or tenderness to the labia or Bartholin's gland area.  There is no surrounding erythema, induration, abnormal warmth, or tenderness.  There is no inguinal lymphadenopathy.  There is no further drainage.  Overall the abscess appears to be resolving well.  Since the Word catheter dislodged on its  own and abscess is healing well, I do not see an indication to replace it.  I instructed her to finish the antibiotics as prescribed she does have OB/GYN follow-up arranged for 10/5 and I instructed her to continue with this appointment and then a determination can be made if she needs any further intervention.  The patient is stable for discharge.  Return precautions given, and she expresses understanding.   ____________________________________________   FINAL CLINICAL IMPRESSION(S) / ED DIAGNOSES  Final diagnoses:  Wound check, abscess      NEW MEDICATIONS STARTED DURING THIS VISIT:  New Prescriptions   No medications on file     Note:  This document was prepared using Dragon voice recognition software and may include unintentional dictation errors.    Dionne Bucy, MD 03/04/21 1144

## 2021-03-04 NOTE — ED Triage Notes (Signed)
Pt comes with c/o abscess and needing wound recheck. Pt here for packing removal.

## 2021-03-04 NOTE — ED Provider Notes (Signed)
Emergency Medicine Provider Triage Evaluation Note  Andrea Chang , a 25 y.o. female  was evaluated in triage.  Pt complains of wound recheck.  Had Bartholin gland cyst I&D 5 days ago.  Balloon placed.  No fever, chills or body aches.  Review of Systems  Positive: Vaginal pain Negative: Fever, chills, nausea, vomiting  Physical Exam  There were no vitals taken for this visit. Gen:   Awake, appears uncomfortable but in no acute distress. Resp:  Normal effort  Neuro:  Alert and oriented  Medical Decision Making  Medically screening exam initiated at 11:02 AM.  Appropriate orders placed.  Drue Flirt was informed that the remainder of the evaluation will be completed by another provider, this initial triage assessment does not replace that evaluation, and the importance of remaining in the ED until their evaluation is complete.     Lorre Munroe, NP 03/04/21 1108    Gilles Chiquito, MD 03/04/21 1524

## 2021-03-07 ENCOUNTER — Encounter: Payer: Self-pay | Admitting: Obstetrics and Gynecology

## 2021-03-08 IMAGING — CT CT RENAL STONE PROTOCOL
3 of 4 series · 9 of 46 positions shown, 16 images · non-contrast
Comparison: June 08, 2018

CLINICAL DATA: Left flank pain.

EXAM:
CT ABDOMEN AND PELVIS WITHOUT CONTRAST
TECHNIQUE: Multidetector CT imaging of the abdomen and pelvis was performed
following the standard protocol without IV contrast.

[Series 4: lung bases · axial · 0.90mm/px · z∈[-78,+6]mm · 5 of 27 slices shown, 10 images]
[im 5/27  soft-tissue]
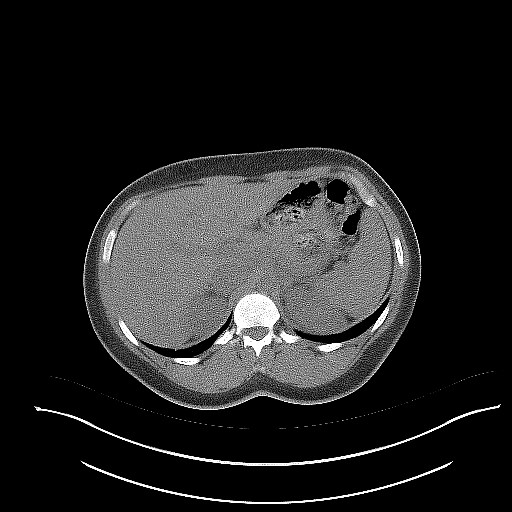
[im 5/27  bone]
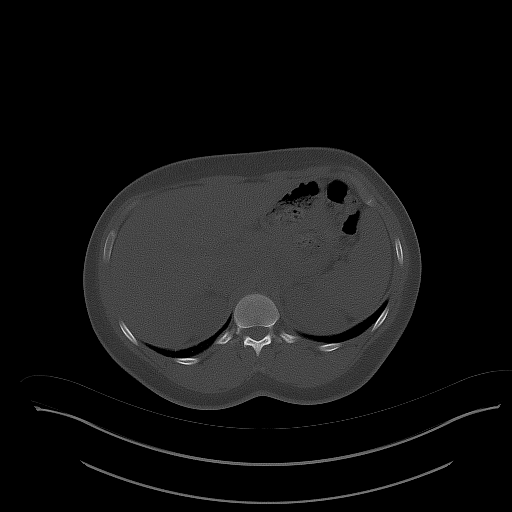
[im 9/27  soft-tissue]
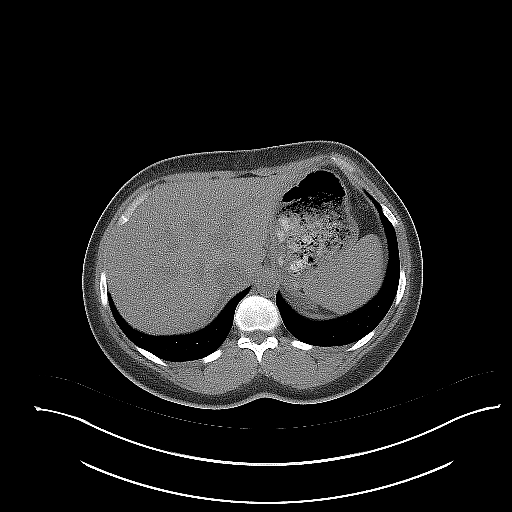
[im 9/27  lung]
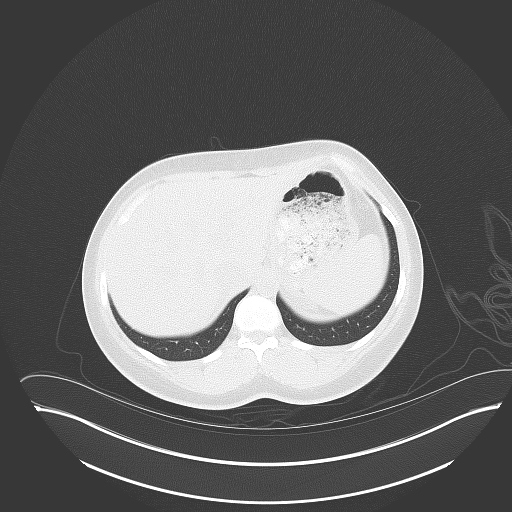
[im 14/27  soft-tissue]
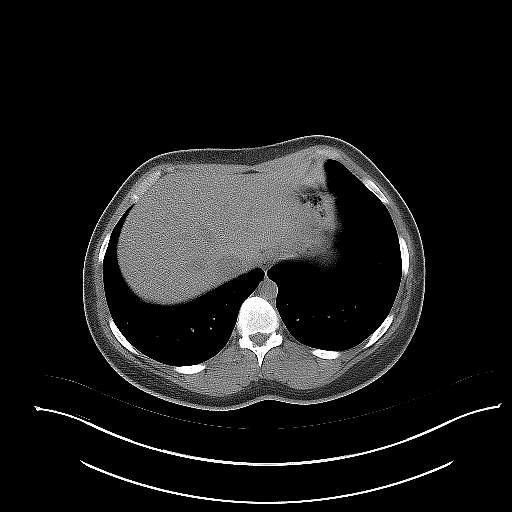
[im 14/27  lung]
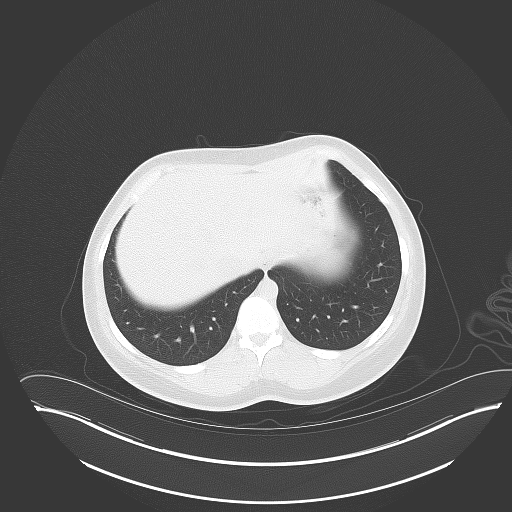
[im 18/27  soft-tissue]
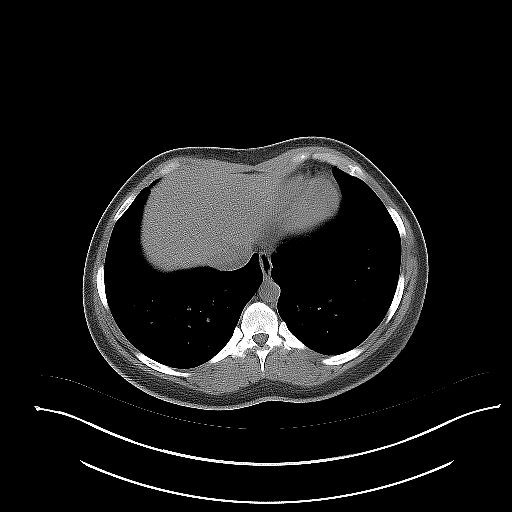
[im 18/27  lung]
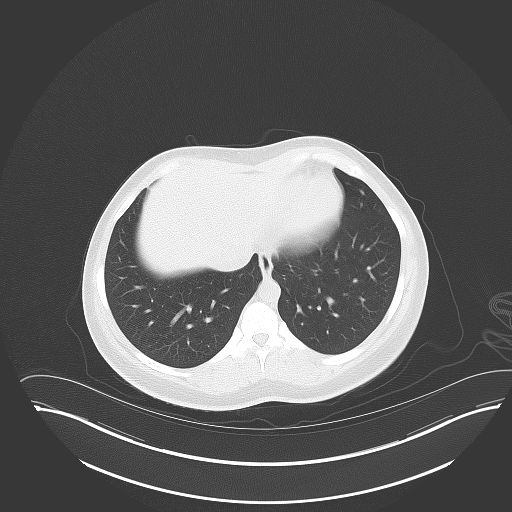
[im 22/27  soft-tissue]
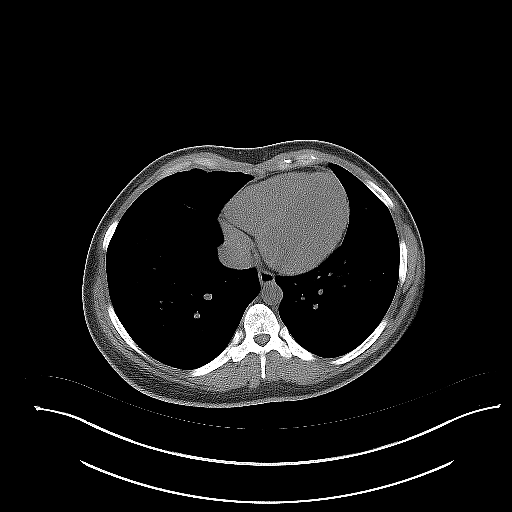
[im 22/27  lung]
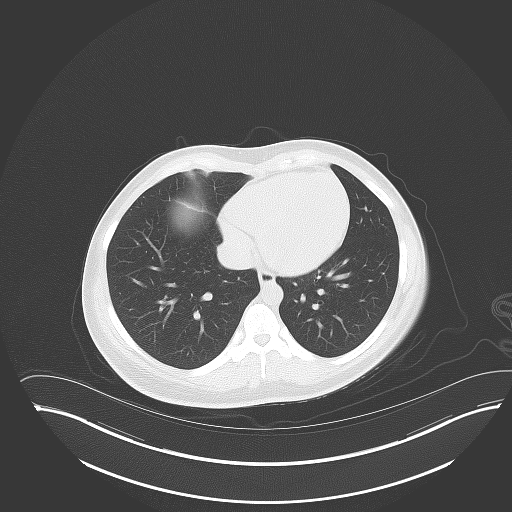

[Series 5: coronal · coronal · 0.78mm/px · 3 of 129 slices shown, 4 images]
[im 43/129  soft-tissue]
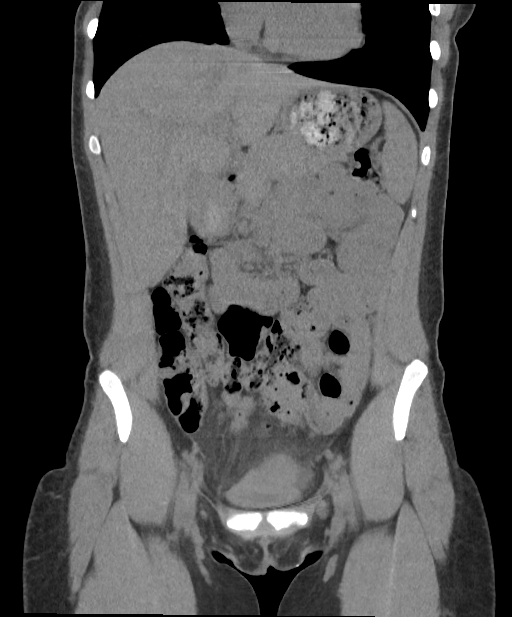
[im 57/129  soft-tissue]
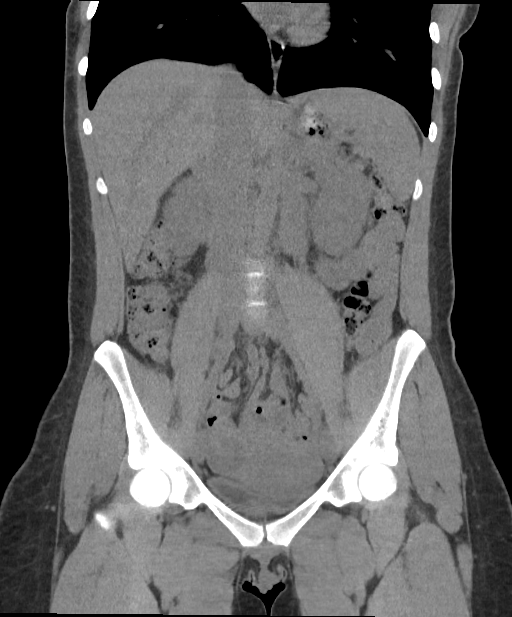
[im 57/129  bone]
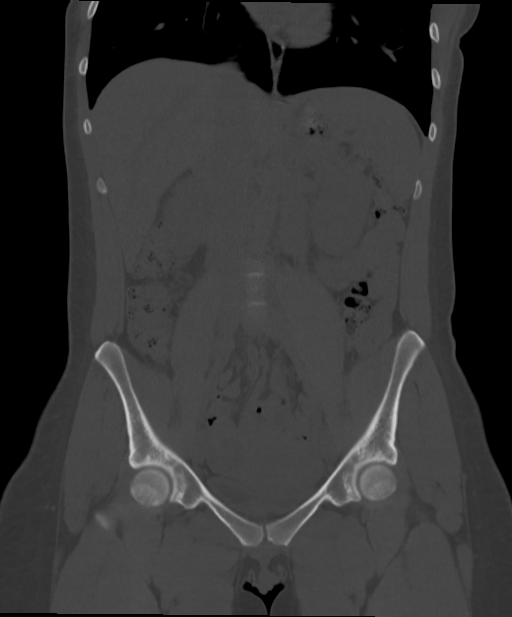
[im 72/129  soft-tissue]
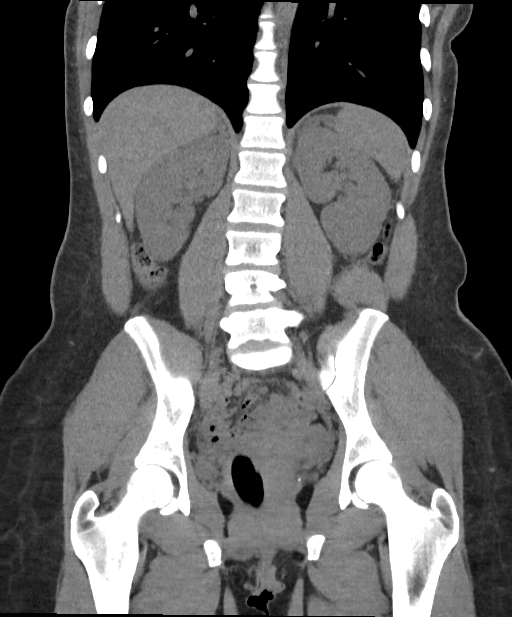

[Series 6: sagittal · sagittal · 0.53mm/px · 1 of 176 slices shown, 2 images]
[im 59/176  soft-tissue]
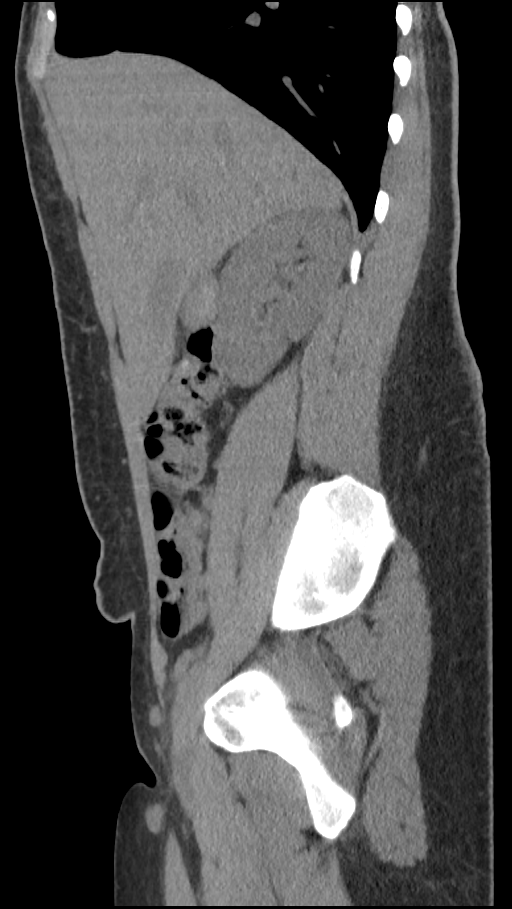
[im 59/176  bone]
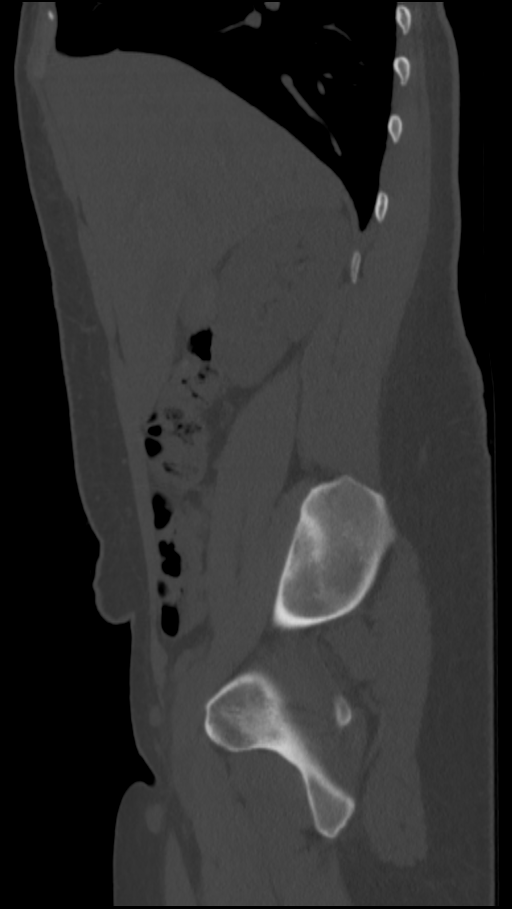

[9 of 46 positions shown; findings below may reference images not displayed]

FINDINGS: Lower chest: No acute abnormality.

Hepatobiliary: No focal liver abnormality is seen. No gallstones,
gallbladder wall thickening, or biliary dilatation.

Pancreas: Unremarkable. No pancreatic ductal dilatation or
surrounding inflammatory changes.

Spleen: Normal in size without focal abnormality.

Adrenals/Urinary Tract: Adrenal glands are unremarkable. Kidneys are
normal, without renal calculi, focal lesion, or hydronephrosis.
Bladder is unremarkable.

Stomach/Bowel: Stomach is within normal limits. Appendix appears
normal. No evidence of bowel wall thickening, distention, or
inflammatory changes.

Vascular/Lymphatic: No significant vascular findings are present. No
enlarged abdominal or pelvic lymph nodes.

Reproductive: Uterus and bilateral adnexa are unremarkable.

Other: No abdominal wall hernia or abnormality. No abdominopelvic
ascites.

Musculoskeletal: No acute or significant osseous findings.
IMPRESSION: No CT evidence of acute intra-abdominal pathology.

## 2021-06-21 ENCOUNTER — Encounter: Payer: Self-pay | Admitting: Emergency Medicine

## 2021-06-21 ENCOUNTER — Ambulatory Visit
Admission: EM | Admit: 2021-06-21 | Discharge: 2021-06-21 | Disposition: A | Discharge status: Home / Self Care | Payer: BC Managed Care – PPO | Attending: Family Medicine | Admitting: Family Medicine

## 2021-06-21 ENCOUNTER — Other Ambulatory Visit: Payer: Self-pay

## 2021-06-21 DIAGNOSIS — J069 Acute upper respiratory infection, unspecified: Secondary | ICD-10-CM | POA: Diagnosis not present

## 2021-06-21 DIAGNOSIS — R52 Pain, unspecified: Secondary | ICD-10-CM | POA: Diagnosis not present

## 2021-06-21 DIAGNOSIS — Z9189 Other specified personal risk factors, not elsewhere classified: Secondary | ICD-10-CM

## 2021-06-21 DIAGNOSIS — R519 Headache, unspecified: Secondary | ICD-10-CM

## 2021-06-21 MED ORDER — PROMETHAZINE-DM 6.25-15 MG/5ML PO SYRP: 5.0000 mL | ORAL_SOLUTION | Freq: Four times a day (QID) | ORAL | 0 refills | Status: DC | PRN

## 2021-06-21 MED ORDER — FLUTICASONE PROPIONATE 50 MCG/ACT NA SUSP: 1.0000 | Freq: Two times a day (BID) | NASAL | 2 refills | Status: DC

## 2021-06-21 NOTE — ED Provider Notes (Signed)
[facility]    CSN: [id number] Arrival date & time: 06/21/21  1249      History   Chief Complaint No chief complaint on file.   HPI Andrea Chang is a 26 y.o. female.   Presenting today with 1 day history of sudden onset sore throat, nasal congestion, sinus pressure, sneezing, right eye redness and drainage, productive cough, bad headache, body aches.  Denies fever, chest pain, shortness of breath, abdominal pain, nausea vomiting or diarrhea.  So far not trying anything over-the-counter for symptoms.  Multiple sick contacts recently.  No known pertinent chronic medical problems.   Past Medical History:  Diagnosis Date   Bartholin cyst    Chlamydia 2013   Ectopic pregnancy    Iron deficiency anemia    Menometrorrhagia    Missed abortion    Normal cardiac stress test    Renal disorder    Sepsis [facility])    Vaginal discharge     Patient Active Problem List   Diagnosis Date Noted   Pyelonephritis 11/25/2015   Sepsis ([place]) 11/25/2015    Past Surgical History:  Procedure Laterality Date   CESAREAN SECTION     DIAGNOSTIC LAPAROSCOPY     DILATION AND CURETTAGE, DIAGNOSTIC / THERAPEUTIC     ECTOPIC PREGNANCY SURGERY      OB History     Gravida  2   Para      Term      Preterm      AB  1   Living         SAB  1   IAB      Ectopic      Multiple      Live Births               Home Medications    Prior to Admission medications   Medication Sig Start Date End Date Taking? Authorizing Provider  fluticasone (FLONASE) 50 MCG/ACT nasal spray Place 1 spray into both nostrils 2 (two) times daily. 06/21/21  Yes Volney American, PA-C  promethazine-dextromethorphan (PROMETHAZINE-DM) 6.25-15 MG/5ML syrup Take 5 mLs by mouth 4 (four) times daily as needed. 06/21/21  Yes Volney American, PA-C  traMADol (ULTRAM) 50 MG tablet Take 1 tablet (50 mg total) by mouth every 6 (six) hours as needed. 02/28/21 02/28/22  Arta Silence, MD     Family History Family History  Problem Relation Age of Onset   Hypertension Mother    Hypertension Father    Colon cancer Maternal Grandfather     Social History Social History   Tobacco Use   Smoking status: Never   Smokeless tobacco: Never  Vaping Use   Vaping Use: Never used  Substance Use Topics   Alcohol use: No   Drug use: No     Allergies   Patient has no known allergies.   Review of Systems Review of Systems Per HPI  Physical Exam Triage Vital Signs ED Triage Vitals  Enc Vitals Group     BP 06/21/21 1254 110/74     Pulse Rate 06/21/21 1254 (!) 103     Resp 06/21/21 1254 16     Temp 06/21/21 1254 98.5 F (36.9 C)     Temp Source 06/21/21 1254 Oral     SpO2 06/21/21 1254 98 %     Weight --      Height --      Head Circumference --      Peak Flow --  Pain Score 06/21/21 1256 6     Pain Loc --      Pain Edu? --      Excl. in [place]? --    No data found.  Updated Vital Signs BP 110/74 (BP Location: Right Arm)    Pulse (!) 103    Temp 98.5 F (36.9 C) (Oral)    Resp 16    LMP 06/05/2021 (Approximate)    SpO2 98%   Visual Acuity Right Eye Distance:   Left Eye Distance:   Bilateral Distance:    Right Eye Near:   Left Eye Near:    Bilateral Near:     Physical Exam Vitals and nursing note reviewed.  Constitutional:      Appearance: Normal appearance. She is not ill-appearing.  HENT:     Head: Atraumatic.     Right Ear: Tympanic membrane normal.     Left Ear: Tympanic membrane normal.     Nose: Rhinorrhea present.     Mouth/Throat:     Mouth: Mucous membranes are moist.     Pharynx: Oropharynx is clear. Posterior oropharyngeal erythema present. No oropharyngeal exudate.  Eyes:     Extraocular Movements: Extraocular movements intact.     Conjunctiva/sclera: Conjunctivae normal.  Cardiovascular:     Rate and Rhythm: Regular rhythm. Tachycardia present.     Heart sounds: Normal heart sounds.  Pulmonary:     Effort: Pulmonary effort  is normal.     Breath sounds: Normal breath sounds. No wheezing or rales.  Abdominal:     General: Bowel sounds are normal. There is no distension.     Palpations: Abdomen is soft.     Tenderness: There is no abdominal tenderness. There is no guarding.  Musculoskeletal:        General: Normal range of motion.     Cervical back: Normal range of motion and neck supple.  Skin:    General: Skin is warm and dry.  Neurological:     Mental Status: She is alert and oriented to person, place, and time.  Psychiatric:        Mood and Affect: Mood normal.        Thought Content: Thought content normal.        Judgment: Judgment normal.     UC Treatments / Results  Labs (all labs ordered are listed, but only abnormal results are displayed) Labs Reviewed  COVID-19, FLU A+B NAA    EKG   Radiology No results found.  Procedures Procedures (including critical care time)  Medications Ordered in UC Medications - No data to display  Initial Impression / Assessment and Plan / UC Course  I have reviewed the triage vital signs and the nursing notes.  Pertinent labs & imaging results that were available during my care of the patient were reviewed by me and considered in my medical decision making (see chart for details).     Mildly tachycardic, otherwise vital signs benign and reassuring.  Suspect viral upper respiratory infection, possibly COVID.  COVID and flu testing pending.  We will treat with Phenergan DM, Flonase, over-the-counter cold and congestion medications, supportive home care.  Return for any acutely worsening symptoms.  Work note given.  Final Clinical Impressions(s) / UC Diagnoses   Final diagnoses:  At increased risk of exposure to COVID-19 virus  Viral URI with cough  Generalized body aches  Bad headache   Discharge Instructions   None    ED Prescriptions     Medication Sig  Dispense Auth. Provider   promethazine-dextromethorphan (PROMETHAZINE-DM) 6.25-15  MG/5ML syrup Take 5 mLs by mouth 4 (four) times daily as needed. 100 mL Volney American, PA-C   fluticasone [facility]) 50 MCG/ACT nasal spray Place 1 spray into both nostrils 2 (two) times daily. 16 g Volney American, [place]      PDMP not reviewed this encounter.   Volney American, [place] 06/21/21 1334

## 2021-06-21 NOTE — ED Triage Notes (Signed)
Sore throat, nasal congestion, sneezing, right eye redness and draining since yesterday.

## 2021-06-22 LAB — COVID-19, FLU A+B NAA
Influenza A, NAA: NOT DETECTED
Influenza B, NAA: NOT DETECTED
SARS-CoV-2, NAA: NOT DETECTED

## 2021-11-27 ENCOUNTER — Ambulatory Visit: Payer: Self-pay

## 2021-11-30 ENCOUNTER — Ambulatory Visit: Payer: Self-pay

## 2021-12-06 ENCOUNTER — Encounter: Payer: Self-pay | Admitting: Advanced Practice Midwife

## 2021-12-06 ENCOUNTER — Ambulatory Visit: Payer: Self-pay | Admitting: Advanced Practice Midwife

## 2021-12-06 DIAGNOSIS — F17200 Nicotine dependence, unspecified, uncomplicated: Secondary | ICD-10-CM

## 2021-12-06 DIAGNOSIS — T7411XA Adult physical abuse, confirmed, initial encounter: Secondary | ICD-10-CM | POA: Insufficient documentation

## 2021-12-06 DIAGNOSIS — Z62810 Personal history of physical and sexual abuse in childhood: Secondary | ICD-10-CM | POA: Insufficient documentation

## 2021-12-06 DIAGNOSIS — T7411XS Adult physical abuse, confirmed, sequela: Secondary | ICD-10-CM

## 2021-12-06 DIAGNOSIS — B9689 Other specified bacterial agents as the cause of diseases classified elsewhere: Secondary | ICD-10-CM

## 2021-12-06 DIAGNOSIS — N760 Acute vaginitis: Secondary | ICD-10-CM

## 2021-12-06 DIAGNOSIS — Z113 Encounter for screening for infections with a predominantly sexual mode of transmission: Secondary | ICD-10-CM

## 2021-12-06 DIAGNOSIS — F419 Anxiety disorder, unspecified: Secondary | ICD-10-CM

## 2021-12-06 LAB — WET PREP FOR TRICH, YEAST, CLUE
Trichomonas Exam: NEGATIVE
Yeast Exam: NEGATIVE

## 2021-12-06 MED ORDER — METRONIDAZOLE 500 MG PO TABS: 500.0000 mg | ORAL_TABLET | Freq: Two times a day (BID) | ORAL | 0 refills | Status: DC

## 2021-12-06 MED ORDER — METRONIDAZOLE 500 MG PO TABS: 500.0000 mg | ORAL_TABLET | Freq: Two times a day (BID) | ORAL | 0 refills | Status: AC

## 2021-12-06 NOTE — Progress Notes (Signed)
WET PREP reveals BV. Treated per standing order. Pt verbalized understanding of further labwork pending.  Lethea Killings RN
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
## 2021-12-10 LAB — GONOCOCCUS CULTURE

## 2021-12-17 ENCOUNTER — Telehealth: Payer: Self-pay

## 2021-12-17 ENCOUNTER — Ambulatory Visit: Payer: Self-pay

## 2021-12-17 DIAGNOSIS — A749 Chlamydial infection, unspecified: Secondary | ICD-10-CM

## 2021-12-17 MED ORDER — DOXYCYCLINE HYCLATE 100 MG PO TABS
100.0000 mg | ORAL_TABLET | Freq: Two times a day (BID) | ORAL | 0 refills | Status: AC
Start: 2021-12-17 — End: 2021-12-24

## 2021-12-17 NOTE — Progress Notes (Signed)
Consulted by RN re: patient situation.  Reviewed RN note and agree that it reflects our discussion and my recommendations. Jess Toney, FNP  

## 2021-12-17 NOTE — Telephone Encounter (Signed)
Calling pt regarding positive chlamydia result from 12/06/21 vaginal specimen. Pt needs tx appt.  Phone call to pt at [phone]. Voicemail not set up, unable to leave message. Tried twice.  Sent MyChart message.

## 2021-12-17 NOTE — Progress Notes (Signed)
In Nurse Clinic for chlamydia tx. On no bcm. LMP 12/11/2021 and last sex 12/12/2021. NKA  Consult A White, FNP who gives ok for pt to be treated per SO Dr Karyl Kinnier with doxycycline 100 mg #14 with instructions to take one capsule twice a day for 7 days.   The patient was dispensed doxycycline 100 mg #14 today. I provided counseling today regarding the medication.  Instructions reviewed. We discussed the medication, the side effects and when to call clinic. Patient given the opportunity to ask questions. Questions answered.  Jerel Shepherd, RN

## 2021-12-17 NOTE — Telephone Encounter (Signed)
Pt returned phone call and confirmed password. Counseled pt re + CT.  Pt states not using BC. LMP 12/11/21- heavy but pretty normal. No sex since last LMP. NKA.  Tx appt scheduled for 12/17/21.

## 2022-01-25 ENCOUNTER — Ambulatory Visit (LOCAL_COMMUNITY_HEALTH_CENTER): Payer: Self-pay

## 2022-01-25 VITALS — BP 114/56 | Ht 67.0 in | Wt 237.0 lb

## 2022-01-25 DIAGNOSIS — Z3201 Encounter for pregnancy test, result positive: Secondary | ICD-10-CM

## 2022-01-25 LAB — PREGNANCY, URINE: Preg Test, Ur: POSITIVE — AB

## 2022-01-25 MED ORDER — PRENATAL 27-0.8 MG PO TABS
1.0000 | ORAL_TABLET | Freq: Every day | ORAL | 0 refills | Status: AC
Start: 2022-01-25 — End: 2022-05-05

## 2022-01-25 NOTE — Progress Notes (Signed)
UPT positive. Plans prenatal care at [facility].  Hx ectopic preg 2012. Consult E Sciora, CNM who recommends RN to counsel regarding signs of ectopic preg and when to seek medical attn.   RN discussed ectopic preg and  provider recommendations. Questions answered and reports understanding.  The patient was dispensed prenatal vitamins #100 today. I provided counseling today regarding the medication. We discussed the medication, the side effects and when to call clinic. Patient given the opportunity to ask questions. Questions answered.    Sent to clerk for preadmit. Jerel Shepherd, RN

## 2022-02-03 ENCOUNTER — Emergency Department: Payer: Medicaid Other

## 2022-02-03 ENCOUNTER — Emergency Department
Admission: EM | Admit: 2022-02-03 | Discharge: 2022-02-03 | Disposition: A | Discharge status: Home / Self Care | Payer: Medicaid Other | Attending: Emergency Medicine | Admitting: Emergency Medicine

## 2022-02-03 ENCOUNTER — Other Ambulatory Visit: Payer: Self-pay

## 2022-02-03 ENCOUNTER — Encounter: Payer: Self-pay | Admitting: Emergency Medicine

## 2022-02-03 DIAGNOSIS — R109 Unspecified abdominal pain: Secondary | ICD-10-CM

## 2022-02-03 DIAGNOSIS — O2241 Hemorrhoids in pregnancy, first trimester: Secondary | ICD-10-CM | POA: Diagnosis not present

## 2022-02-03 DIAGNOSIS — Z3A01 Less than 8 weeks gestation of pregnancy: Secondary | ICD-10-CM | POA: Insufficient documentation

## 2022-02-03 DIAGNOSIS — O26891 Other specified pregnancy related conditions, first trimester: Secondary | ICD-10-CM | POA: Diagnosis present

## 2022-02-03 DIAGNOSIS — K648 Other hemorrhoids: Secondary | ICD-10-CM

## 2022-02-03 LAB — COMPREHENSIVE METABOLIC PANEL
ALT: 12 U/L (ref 0–44)
AST: 17 U/L (ref 15–41)
Albumin: 3.9 g/dL (ref 3.5–5.0)
Alkaline Phosphatase: 31 U/L — ABNORMAL LOW (ref 38–126)
Anion gap: 9 (ref 5–15)
BUN: 12 mg/dL (ref 6–20)
CO2: 25 mmol/L (ref 22–32)
Calcium: 9.1 mg/dL (ref 8.9–10.3)
Chloride: 102 mmol/L (ref 98–111)
Creatinine, Ser: 0.72 mg/dL (ref 0.44–1.00)
GFR, Estimated: >60 mL/min (ref >60)
Glucose, Bld: 96 mg/dL (ref 70–99)
Potassium: 3.8 mmol/L (ref 3.5–5.1)
Sodium: 136 mmol/L (ref 135–145)
Total Bilirubin: 0.5 mg/dL (ref 0.3–1.2)
Total Protein: 8 g/dL (ref 6.5–8.1)

## 2022-02-03 LAB — URINALYSIS, ROUTINE W REFLEX MICROSCOPIC
Bilirubin Urine: NEGATIVE
Glucose, UA: NEGATIVE mg/dL
Hgb urine dipstick: NEGATIVE
Ketones, ur: NEGATIVE mg/dL
Nitrite: POSITIVE — AB
Protein, ur: NEGATIVE mg/dL
Specific Gravity, Urine: 1.021 (ref 1.005–1.030)
pH: 6 (ref 5.0–8.0)

## 2022-02-03 LAB — HCG, QUANTITATIVE, PREGNANCY: hCG, Beta Chain, Quant, S: 25586 m[IU]/mL — ABNORMAL HIGH (ref <5)

## 2022-02-03 LAB — CBC
HCT: 37.8 % (ref 36.0–46.0)
Hemoglobin: 11.8 g/dL — ABNORMAL LOW (ref 12.0–15.0)
MCH: 26.8 pg (ref 26.0–34.0)
MCHC: 31.2 g/dL (ref 30.0–36.0)
MCV: 85.9 fL (ref 80.0–100.0)
Platelets: 217 10*3/uL (ref 150–400)
RBC: 4.4 MIL/uL (ref 3.87–5.11)
RDW: 14.6 % (ref 11.5–15.5)
WBC: 8.5 10*3/uL (ref 4.0–10.5)
nRBC: 0 % (ref 0.0–0.2)

## 2022-02-03 LAB — LIPASE, BLOOD: Lipase: 30 U/L (ref 11–51)

## 2022-02-03 MED ORDER — CEPHALEXIN 500 MG PO CAPS: 500.0000 mg | ORAL_CAPSULE | Freq: Four times a day (QID) | ORAL | 0 refills | Status: AC

## 2022-02-03 MED ORDER — DIBUCAINE (PERIANAL) 1 % EX OINT: 1.0000 | TOPICAL_OINTMENT | CUTANEOUS | 0 refills | Status: DC | PRN

## 2022-02-03 MED ORDER — CEPHALEXIN 500 MG PO CAPS: 500.0000 mg | ORAL_CAPSULE | Freq: Four times a day (QID) | ORAL | 0 refills | Status: DC

## 2022-02-03 NOTE — ED Provider Notes (Signed)
[facility] Provider Note  Patient Contact: 9:24 PM (approximate)   History   Abdominal Cramping   HPI  Andrea Chang is a 26 y.o. female G24, P1 with a history of prior ectopic pregnancy, presents to the emergency department with some right lower abdominal discomfort that started while patient was at work and patient had to leave early.  She denies fever or chills.  She states that her abdominal pain is improved since being in the emergency department.  She denies dysuria, hematuria or increased urinary frequency.  No chest pain, chest tightness or shortness of breath.      Physical Exam   Triage Vital Signs: ED Triage Vitals  Enc Vitals Group     BP 02/03/22 1520 125/72     Pulse Rate 02/03/22 1520 86     Resp 02/03/22 1520 20     Temp 02/03/22 1520 98.7 F (37.1 C)     Temp Source 02/03/22 1520 Oral     SpO2 02/03/22 1520 100 %     Weight 02/03/22 1519 240 lb (108.9 kg)     Height 02/03/22 1519 5\' 6"  (1.676 m)     Head Circumference --      Peak Flow --      Pain Score 02/03/22 1519 8     Pain Loc --      Pain Edu? --      Excl. in [place]? --     Most recent vital signs: Vitals:   02/03/22 1915 02/03/22 1916  BP: 111/64   Pulse: 79   Resp: 18   Temp:  98.5 F (36.9 C)  SpO2: 99%      General: Alert and in no acute distress. Eyes:  PERRL. EOMI. Head: No acute traumatic findings ENT:      Nose: No congestion/rhinnorhea.      Mouth/Throat: Mucous membranes are moist. Neck: No stridor. No cervical spine tenderness to palpation. Cardiovascular:  Good peripheral perfusion Respiratory: Normal respiratory effort without tachypnea or retractions. Lungs CTAB. Good air entry to the bases with no decreased or absent breath sounds. Gastrointestinal: Bowel sounds 4 quadrants. Soft and nontender to palpation. No guarding or rigidity. No palpable masses. No distention. No CVA tenderness. Musculoskeletal: Full range of motion to all extremities.   Neurologic:  No gross focal neurologic deficits are appreciated.  Skin:   No rash noted Other:   ED Results / Procedures / Treatments   Labs (all labs ordered are listed, but only abnormal results are displayed) Labs Reviewed  COMPREHENSIVE METABOLIC PANEL - Abnormal; Notable for the following components:      Result Value   Alkaline Phosphatase 31 (*)    All other components within normal limits  CBC - Abnormal; Notable for the following components:   Hemoglobin 11.8 (*)    All other components within normal limits  URINALYSIS, ROUTINE W REFLEX MICROSCOPIC - Abnormal; Notable for the following components:   Color, Urine YELLOW (*)    APPearance CLOUDY (*)    Nitrite POSITIVE (*)    Leukocytes,Ua LARGE (*)    Bacteria, UA FEW (*)    All other components within normal limits  HCG, QUANTITATIVE, PREGNANCY - Abnormal; Notable for the following components:   hCG, Beta Chain, Quant, S 25,586 (*)    All other components within normal limits  LIPASE, BLOOD  POC URINE PREG, ED       RADIOLOGY  I personally viewed and evaluated these images as part of  my medical decision making, as well as reviewing the written report by the radiologist.  ED Provider Interpretation: Single viable intrauterine pregnancy at 6 weeks 2 days with fetal heart rate at 120 bpm   PROCEDURES:  Critical Care performed: No  Procedures   MEDICATIONS ORDERED IN ED: Medications - No data to display   IMPRESSION / MDM / ASSESSMENT AND PLAN / ED COURSE  I reviewed the triage vital signs and the nursing notes.                              Assessment and plan Abdominal pain in pregnancy 26 year old female presents to the emergency department with right lower quadrant abdominal discomfort that started while patient was at work in the absence of vomiting.   Vital signs are reassuring at triage.  On exam, patient was alert, active and nontoxic-appearing with no abdominal tenderness or guarding  elicited to palpation.  CBC, CMP and lipase within range.  Urinalysis shows bacteria, nitrates and large leuks.  Beta-hCG was appropriately elevated and dedicated OB ultrasound shows single viable intrauterine pregnancy at 6 weeks and 2 days.  I had an extensive conversation with patient about concern for possible appendicitis given patient's earlier right lower quadrant abdominal pain and patient declined MRI abdomen and pelvis at this time stating that she wanted to observe her symptoms at home.    Given reassuring vital signs, normal white blood cell count and absence of pain on physical exam, I do feel that this is a reasonable request at this time.  Patient was discharged with Keflex given findings concerning for urinary tract infection.  Patient requested a work note and work note was given.  Return precautions were given to return to the emergency department with fever, chills, vomiting or worsening abdominal pain.  Patient secondarily expressed concern for hemorrhoids and they became ointment was prescribed for discomfort.  I also recommended increased intake of soluble fiber.     FINAL CLINICAL IMPRESSION(S) / ED DIAGNOSES   Final diagnoses:  Abdominal pain during pregnancy in first trimester  Other hemorrhoids     Rx / DC Orders   ED Discharge Orders          Ordered    cephALEXin (KEFLEX) 500 MG capsule  4 times daily,   Status:  Discontinued        02/03/22 2118    cephALEXin (KEFLEX) 500 MG capsule  4 times daily        02/03/22 2119    dibucaine (NUPERCAINAL) 1 % OINT  As needed        02/03/22 2119             Note:  This document was prepared using Dragon voice recognition software and may include unintentional dictation errors.   Pia Mau [place], Cordelia Poche 02/03/22 2131    Minna Antis, MD 02/03/22 2258

## 2022-02-03 NOTE — ED Triage Notes (Signed)
Pt reports is [redacted] weeks pregnant, was at work and started with cramping to her right side abd. Pt reports not vaginal bleeding but does have some white discharge. Pt reports pain is constant.

## 2022-02-03 NOTE — Discharge Instructions (Addendum)
Take Keflex 4 times daily for the next 7 days. If your abdominal pain worsens or you experience fever/vomiting, please return to the emergency department for reevaluation.

## 2022-02-03 NOTE — ED Notes (Signed)
Lab called and verified that patient's blood type is O+.   PA made aware

## 2022-02-11 ENCOUNTER — Telehealth: Payer: Self-pay

## 2022-02-11 NOTE — Telephone Encounter (Signed)
Patient came in the office to scheduled NOB intake. Patient states her medicaid hasn't been approved yet. I advised patient she would be self pay. Patient states she will call back when medicaid is  active.

## 2022-02-11 NOTE — Telephone Encounter (Signed)
Patient has confirmed pregnancy with [facility]. She is ~[redacted] weeks pregnant. Requesting to schedule NOB appointment.

## 2022-02-12 ENCOUNTER — Encounter: Payer: Self-pay | Admitting: Obstetrics & Gynecology

## 2022-02-19 ENCOUNTER — Telehealth: Payer: Self-pay | Admitting: Licensed Practical Nurse

## 2022-02-19 NOTE — Telephone Encounter (Signed)
This pt called in at 11:55 about her New OB intake appt.  Said that she had not received a call and was calling for the appt.

## 2022-02-19 NOTE — Telephone Encounter (Signed)
Well, that explains it.  Thanks!

## 2022-02-20 ENCOUNTER — Ambulatory Visit: Payer: Medicaid Other

## 2022-02-25 ENCOUNTER — Telehealth: Payer: Self-pay

## 2022-02-25 NOTE — Telephone Encounter (Signed)
Call to patient to verify if she will keep New OB appt with [facility] or keep new OB appt with [facility] on 03/05/2022.   Per patient she states she will keep appt with [facility] stating that she forgot to cancel with [facility].   Verified with patient that [facility] New OB appt for 02/27/22 will be cancelled. Patient verbalized understanding.   Al Decant, RN

## 2022-02-27 ENCOUNTER — Other Ambulatory Visit: Payer: Self-pay

## 2022-03-04 ENCOUNTER — Ambulatory Visit (INDEPENDENT_AMBULATORY_CARE_PROVIDER_SITE_OTHER): Payer: Medicaid Other

## 2022-03-04 ENCOUNTER — Ambulatory Visit: Payer: Medicaid Other

## 2022-03-04 VITALS — Wt 230.0 lb

## 2022-03-04 DIAGNOSIS — Z369 Encounter for antenatal screening, unspecified: Secondary | ICD-10-CM

## 2022-03-04 DIAGNOSIS — Z3480 Encounter for supervision of other normal pregnancy, unspecified trimester: Secondary | ICD-10-CM | POA: Insufficient documentation

## 2022-03-04 DIAGNOSIS — Z3689 Encounter for other specified antenatal screening: Secondary | ICD-10-CM
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
## 2022-03-05 ENCOUNTER — Other Ambulatory Visit: Payer: Medicaid Other

## 2022-03-05 ENCOUNTER — Other Ambulatory Visit: Payer: Self-pay

## 2022-03-05 DIAGNOSIS — Z3480 Encounter for supervision of other normal pregnancy, unspecified trimester: Secondary | ICD-10-CM

## 2022-03-05 DIAGNOSIS — Z369 Encounter for antenatal screening, unspecified: Secondary | ICD-10-CM

## 2022-03-05 LAB — OB RESULTS CONSOLE VARICELLA ZOSTER ANTIBODY, IGG: Varicella: NON-IMMUNE/NOT IMMUNE

## 2022-03-06 LAB — CBC/D/PLT+RPR+RH+ABO+RUBIGG...
Antibody Screen: NEGATIVE
Basophils Absolute: 0 10*3/uL (ref 0.0–0.2)
Basos: 0 %
EOS (ABSOLUTE): 0.1 10*3/uL (ref 0.0–0.4)
Eos: 1 %
HCV Ab: NONREACTIVE
HIV Screen 4th Generation wRfx: NONREACTIVE
Hematocrit: 40 % (ref 34.0–46.6)
Hemoglobin: 12.8 g/dL (ref 11.1–15.9)
Hepatitis B Surface Ag: NEGATIVE
Immature Grans (Abs): 0 10*3/uL (ref 0.0–0.1)
Immature Granulocytes: 0 %
Lymphocytes Absolute: 1.7 10*3/uL (ref 0.7–3.1)
Lymphs: 22 %
MCH: 27.4 pg (ref 26.6–33.0)
MCHC: 32 g/dL (ref 31.5–35.7)
MCV: 86 fL (ref 79–97)
Monocytes Absolute: 0.6 10*3/uL (ref 0.1–0.9)
Monocytes: 8 %
Neutrophils Absolute: 5.3 10*3/uL (ref 1.4–7.0)
Neutrophils: 69 %
Platelets: 241 10*3/uL (ref 150–450)
RBC: 4.67 x10E6/uL (ref 3.77–5.28)
RDW: 14 % (ref 11.7–15.4)
RPR Ser Ql: NONREACTIVE
Rh Factor: POSITIVE
Rubella Antibodies, IGG: 5.2 {index}
Varicella zoster IgG: <135 {index} — ABNORMAL LOW
WBC: 7.8 10*3/uL (ref 3.4–10.8)

## 2022-03-06 LAB — HCV INTERPRETATION

## 2022-03-08 ENCOUNTER — Other Ambulatory Visit (HOSPITAL_COMMUNITY)
Admission: RE | Admit: 2022-03-08 | Discharge: 2022-03-08 | Disposition: A | Discharge status: Home / Self Care | Payer: Medicaid Other | Source: Ambulatory Visit | Attending: Licensed Practical Nurse | Admitting: Licensed Practical Nurse

## 2022-03-08 ENCOUNTER — Encounter: Payer: Self-pay | Admitting: Licensed Practical Nurse

## 2022-03-08 ENCOUNTER — Ambulatory Visit (INDEPENDENT_AMBULATORY_CARE_PROVIDER_SITE_OTHER): Payer: Medicaid Other | Admitting: Licensed Practical Nurse

## 2022-03-08 VITALS — BP 127/75 | HR 98 | Wt 244.0 lb

## 2022-03-08 DIAGNOSIS — O2301 Infections of kidney in pregnancy, first trimester: Secondary | ICD-10-CM

## 2022-03-08 DIAGNOSIS — N898 Other specified noninflammatory disorders of vagina: Secondary | ICD-10-CM

## 2022-03-08 DIAGNOSIS — O26891 Other specified pregnancy related conditions, first trimester: Secondary | ICD-10-CM | POA: Diagnosis present

## 2022-03-08 DIAGNOSIS — Z3A11 11 weeks gestation of pregnancy: Secondary | ICD-10-CM

## 2022-03-08 DIAGNOSIS — Z124 Encounter for screening for malignant neoplasm of cervix: Secondary | ICD-10-CM | POA: Diagnosis present

## 2022-03-08 DIAGNOSIS — O99341 Other mental disorders complicating pregnancy, first trimester: Secondary | ICD-10-CM

## 2022-03-08 DIAGNOSIS — F17210 Nicotine dependence, cigarettes, uncomplicated: Secondary | ICD-10-CM

## 2022-03-08 DIAGNOSIS — Z3689 Encounter for other specified antenatal screening: Secondary | ICD-10-CM | POA: Diagnosis not present

## 2022-03-08 DIAGNOSIS — F419 Anxiety disorder, unspecified: Secondary | ICD-10-CM

## 2022-03-08 DIAGNOSIS — Z113 Encounter for screening for infections with a predominantly sexual mode of transmission: Secondary | ICD-10-CM

## 2022-03-08 DIAGNOSIS — Z3480 Encounter for supervision of other normal pregnancy, unspecified trimester: Secondary | ICD-10-CM | POA: Diagnosis present

## 2022-03-08 DIAGNOSIS — O99331 Smoking (tobacco) complicating pregnancy, first trimester: Secondary | ICD-10-CM

## 2022-03-08 DIAGNOSIS — R3989 Other symptoms and signs involving the genitourinary system: Secondary | ICD-10-CM

## 2022-03-08 DIAGNOSIS — O09291 Supervision of pregnancy with other poor reproductive or obstetric history, first trimester: Secondary | ICD-10-CM

## 2022-03-08 DIAGNOSIS — Z0283 Encounter for blood-alcohol and blood-drug test: Secondary | ICD-10-CM

## 2022-03-08 LAB — POCT URINALYSIS DIPSTICK
Blood, UA: NEGATIVE
Glucose, UA: NEGATIVE
Leukocytes, UA: NEGATIVE
Protein, UA: NEGATIVE
Spec Grav, UA: 1.015 (ref 1.010–1.025)
Urobilinogen, UA: 0.2 E.U./dL
pH, UA: 6 (ref 5.0–8.0)

## 2022-03-09 LAB — MATERNIT 21 PLUS CORE, BLOOD
Fetal Fraction: 8
Result (T21): NEGATIVE
Trisomy 13 (Patau syndrome): NEGATIVE
Trisomy 18 (Edwards syndrome): NEGATIVE
Trisomy 21 (Down syndrome): NEGATIVE

## 2022-03-10 LAB — HERPES SIMPLEX VIRUS CULTURE

## 2022-03-12 ENCOUNTER — Telehealth: Payer: Self-pay

## 2022-03-12 DIAGNOSIS — B9689 Other specified bacterial agents as the cause of diseases classified elsewhere: Secondary | ICD-10-CM

## 2022-03-12 LAB — CERVICOVAGINAL ANCILLARY ONLY
Bacterial Vaginitis (gardnerella): POSITIVE — AB
Candida Glabrata: NEGATIVE
Candida Vaginitis: POSITIVE — AB
Comment: NEGATIVE
Comment: NEGATIVE
Comment: NEGATIVE

## 2022-03-12 LAB — URINE CULTURE

## 2022-03-12 MED ORDER — METRONIDAZOLE 500 MG PO TABS: 500.0000 mg | ORAL_TABLET | Freq: Two times a day (BID) | ORAL | 0 refills | Status: DC

## 2022-03-12 NOTE — Telephone Encounter (Signed)
Pt called needs the letter for the dentist, needed to know if med's had been sent in for her Yeast and BV. I advised to take monistat for yeast and Metronidazole sent to pharmacy for BV, Pt also wanted to know sex of the baby, I advised results say consistent with Female.

## 2022-03-13 ENCOUNTER — Encounter: Payer: Self-pay | Admitting: Licensed Practical Nurse

## 2022-03-13 ENCOUNTER — Other Ambulatory Visit: Payer: Self-pay | Admitting: Licensed Practical Nurse

## 2022-03-13 DIAGNOSIS — N390 Urinary tract infection, site not specified: Secondary | ICD-10-CM

## 2022-03-13 LAB — CYTOLOGY - PAP
Chlamydia: NEGATIVE
Comment: NEGATIVE
Comment: NEGATIVE
Comment: NORMAL
Diagnosis: NEGATIVE
Neisseria Gonorrhea: NEGATIVE
Trichomonas: NEGATIVE

## 2022-03-13 LAB — URINE DRUG PANEL 7
Amphetamines, Urine: NEGATIVE ng/mL
Barbiturate Quant, Ur: NEGATIVE ng/mL
Benzodiazepine Quant, Ur: NEGATIVE ng/mL
Cannabinoid Quant, Ur: POSITIVE — AB
Cocaine (Metab.): NEGATIVE ng/mL
Opiate Quant, Ur: NEGATIVE ng/mL
PCP Quant, Ur: NEGATIVE ng/mL

## 2022-03-13 MED ORDER — AMOXICILLIN-POT CLAVULANATE 500-125 MG PO TABS: 1.0000 | ORAL_TABLET | Freq: Two times a day (BID) | ORAL | 0 refills | Status: AC
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
## 2022-03-14 ENCOUNTER — Emergency Department
Admission: EM | Admit: 2022-03-14 | Discharge: 2022-03-14 | Disposition: A | Discharge status: Home / Self Care | Payer: Medicaid Other | Attending: Emergency Medicine | Admitting: Emergency Medicine

## 2022-03-14 ENCOUNTER — Emergency Department: Payer: Medicaid Other

## 2022-03-14 ENCOUNTER — Other Ambulatory Visit: Payer: Self-pay

## 2022-03-14 DIAGNOSIS — O26891 Other specified pregnancy related conditions, first trimester: Secondary | ICD-10-CM | POA: Diagnosis not present

## 2022-03-14 DIAGNOSIS — O26851 Spotting complicating pregnancy, first trimester: Secondary | ICD-10-CM | POA: Diagnosis present

## 2022-03-14 DIAGNOSIS — R1030 Lower abdominal pain, unspecified: Secondary | ICD-10-CM | POA: Diagnosis not present

## 2022-03-14 DIAGNOSIS — Z3A11 11 weeks gestation of pregnancy: Secondary | ICD-10-CM | POA: Insufficient documentation

## 2022-03-14 DIAGNOSIS — O209 Hemorrhage in early pregnancy, unspecified: Secondary | ICD-10-CM

## 2022-03-14 DIAGNOSIS — R102 Pelvic and perineal pain: Secondary | ICD-10-CM | POA: Insufficient documentation

## 2022-03-14 LAB — URINALYSIS, ROUTINE W REFLEX MICROSCOPIC
Bilirubin Urine: NEGATIVE
Glucose, UA: NEGATIVE mg/dL
Hgb urine dipstick: NEGATIVE
Ketones, ur: NEGATIVE mg/dL
Nitrite: NEGATIVE
Protein, ur: NEGATIVE mg/dL
Specific Gravity, Urine: 1.029 (ref 1.005–1.030)
pH: 5 (ref 5.0–8.0)

## 2022-03-14 LAB — CBC WITH DIFFERENTIAL/PLATELET
Abs Immature Granulocytes: 0.04 10*3/uL (ref 0.00–0.07)
Basophils Absolute: 0 10*3/uL (ref 0.0–0.1)
Basophils Relative: 0 %
Eosinophils Absolute: 0 10*3/uL (ref 0.0–0.5)
Eosinophils Relative: 0 %
HCT: 41.7 % (ref 36.0–46.0)
Hemoglobin: 13 g/dL (ref 12.0–15.0)
Immature Granulocytes: 0 %
Lymphocytes Relative: 19 %
Lymphs Abs: 1.9 10*3/uL (ref 0.7–4.0)
MCH: 27 pg (ref 26.0–34.0)
MCHC: 31.2 g/dL (ref 30.0–36.0)
MCV: 86.7 fL (ref 80.0–100.0)
Monocytes Absolute: 0.8 10*3/uL (ref 0.1–1.0)
Monocytes Relative: 8 %
Neutro Abs: 7 10*3/uL (ref 1.7–7.7)
Neutrophils Relative %: 73 %
Platelets: 214 10*3/uL (ref 150–400)
RBC: 4.81 MIL/uL (ref 3.87–5.11)
RDW: 13.8 % (ref 11.5–15.5)
WBC: 9.8 10*3/uL (ref 4.0–10.5)
nRBC: 0 % (ref 0.0–0.2)

## 2022-03-14 LAB — COMPREHENSIVE METABOLIC PANEL
ALT: 11 U/L (ref 0–44)
AST: 14 U/L — ABNORMAL LOW (ref 15–41)
Albumin: 3.8 g/dL (ref 3.5–5.0)
Alkaline Phosphatase: 32 U/L — ABNORMAL LOW (ref 38–126)
Anion gap: 11 (ref 5–15)
BUN: 11 mg/dL (ref 6–20)
CO2: 22 mmol/L (ref 22–32)
Calcium: 9.4 mg/dL (ref 8.9–10.3)
Chloride: 101 mmol/L (ref 98–111)
Creatinine, Ser: 0.58 mg/dL (ref 0.44–1.00)
GFR, Estimated: >60 mL/min (ref >60)
Glucose, Bld: 84 mg/dL (ref 70–99)
Potassium: 3.7 mmol/L (ref 3.5–5.1)
Sodium: 134 mmol/L — ABNORMAL LOW (ref 135–145)
Total Bilirubin: 0.4 mg/dL (ref 0.3–1.2)
Total Protein: 8 g/dL (ref 6.5–8.1)

## 2022-03-14 LAB — POC URINE PREG, ED: Preg Test, Ur: POSITIVE — AB

## 2022-03-14 LAB — HCG, QUANTITATIVE, PREGNANCY: hCG, Beta Chain, Quant, S: 71485 m[IU]/mL — ABNORMAL HIGH (ref <5)

## 2022-03-14 LAB — ABO/RH: ABO/RH(D): O POS

## 2022-03-14 MED ORDER — ONDANSETRON 4 MG PO TBDP: 4.0000 mg | ORAL_TABLET | Freq: Three times a day (TID) | ORAL | 0 refills | Status: DC | PRN

## 2022-03-14 MED ORDER — ONDANSETRON 4 MG PO TBDP
4.0000 mg | ORAL_TABLET | Freq: Once | ORAL | Status: AC
  Administered 2022-03-14: 4 mg via ORAL
  Filled 2022-03-14: qty 1

## 2022-03-14 NOTE — ED Provider Notes (Signed)
[facility] Emergency Department Provider Note     Event Date/Time   First MD Initiated Contact with Patient 03/14/22 1436     (approximate)   History   Abdominal Cramping   HPI  Andrea Chang is a 26 y.o. female G4P1 at [redacted] weeks gestation. She presents to the today with with complaints of lower abdominal cramping with onset last night.  She also endorses some firm stools, but denies any diarrhea, fevers, chills, or sweats.  Patient also denies any nausea, vomiting.  She was evaluated last week at the [facility], for some vaginal discharge.  She was started empirically on metronidazole for BV, but was notified yesterday by phone that she had a positive UA.  Her Augmentin was called in yesterday, patient has not started that medicine at this time.  She presents today, noting some bright red blood on toilet tissue when she wiped today.  Denies any ongoing active bleeding, the need for pantiliner or maxi pad.  Physical Exam   Triage Vital Signs: ED Triage Vitals [03/14/22 1358]  Enc Vitals Group     BP 121/79     Pulse Rate 73     Resp 18     Temp 98.7 F (37.1 C)     Temp Source Oral     SpO2 100 %     Weight 240 lb (108.9 kg)     Height      Head Circumference      Peak Flow      Pain Score 6     Pain Loc      Pain Edu?      Excl. in GC?     Most recent vital signs: Vitals:   03/14/22 1358 03/14/22 1631  BP: 121/79 (!) 132/96  Pulse: 73 93  Resp: 18 16  Temp: 98.7 F (37.1 C)   SpO2: 100% 100%    General Awake, no distress. NAD CV:  Good peripheral perfusion.  RESP:  Normal effort.  ABD:  No distention. Soft,   GU:  Deferred   ED Results / Procedures / Treatments   Labs (all labs ordered are listed, but only abnormal results are displayed) Labs Reviewed  COMPREHENSIVE METABOLIC PANEL - Abnormal; Notable for the following components:      Result Value   Sodium 134 (*)    AST 14 (*)    Alkaline Phosphatase 32 (*)    All  other components within normal limits  HCG, QUANTITATIVE, PREGNANCY - Abnormal; Notable for the following components:   hCG, Beta Chain, Quant, S 71,485 (*)    All other components within normal limits  URINALYSIS, ROUTINE W REFLEX MICROSCOPIC - Abnormal; Notable for the following components:   Color, Urine YELLOW (*)    APPearance CLOUDY (*)    Leukocytes,Ua SMALL (*)    Bacteria, UA FEW (*)    All other components within normal limits  POC URINE PREG, ED - Abnormal; Notable for the following components:   Preg Test, Ur POSITIVE (*)    All other components within normal limits  CBC WITH DIFFERENTIAL/PLATELET  ABO/RH     EKG   RADIOLOGY  I personally viewed and evaluated these images as part of my medical decision making, as well as reviewing the written report by the radiologist.  ED Provider Interpretation: IUP  US OB Comp Less 14 Wks  Result Date: 03/14/2022 CLINICAL DATA:  Pelvic pain and vaginal bleeding EXAM: OBSTETRIC <14 WK [place] AND  TRANSVAGINAL OB US TECHNIQUE: Both transabdominal and transvaginal ultrasound examinations were performed for complete evaluation of the gestation as well as the maternal uterus, adnexal regions, and pelvic cul-de-sac. Transvaginal technique was performed to assess early pregnancy. COMPARISON:  Ob ultrasound dated February 03, 2022 FINDINGS: Intrauterine gestational sac: Single Yolk sac:  Not Visualized. Embryo:  Visualized. Cardiac Activity: Visualized. Heart Rate: 160 bpm CRL:  57.5 mm   12 w   2 d                  [place] EDC: 09/24/2022 Subchorionic hemorrhage:  None visualized. Maternal uterus/adnexae: Normal appearance of the bilateral ovaries. Small subserosal fibroid of the mid anterior uterus measuring 2.0 x 1.4 x 2.1 cm. No free fluid seen in the pelvis. IMPRESSION: Single viable intrauterine pregnancy with estimated gestational age of [redacted] weeks 2 days. Electronically Signed   By: Yetta Glassman M.D.   On: 03/14/2022 16:20      PROCEDURES:  Critical Care performed: No  Procedures   MEDICATIONS ORDERED IN ED: Medications  ondansetron (ZOFRAN-ODT) disintegrating tablet 4 mg (4 mg Oral Given 03/14/22 1457)     IMPRESSION / MDM / ASSESSMENT AND PLAN / ED COURSE  I reviewed the triage vital signs and the nursing notes.                              Differential diagnosis includes, but is not limited to, but is not limited to, threatened miscarriage, incomplete miscarriage, normal bleeding from an early trimester pregnancy, ectopic pregnancy, , blighted ovum, vaginal/cervical trauma, subchorionic hemorrhage/hematoma, etc.   Patient's presentation is most consistent with acute complicated illness / injury requiring diagnostic workup.  Patient's diagnosis is consistent with first trimester vaginal bleeding with an ultrasound confirmed IUP.  No evidence of subchorionic hemorrhage on ultrasound/Doppler.  Reviewed the images and interpreted them independently of the radiology report.  Patient will be discharged home with prescriptions for Zofran.  Patient is also encouraged to start the Augmentin prescribed for her previously diagnosed UTI, and complete the metronidazole provided for her BV diagnosis.  Patient is to follow up with [facility] as needed or otherwise directed. Patient is given ED precautions to return to the ED for any worsening or new symptoms.     FINAL CLINICAL IMPRESSION(S) / ED DIAGNOSES   Final diagnoses:  Vaginal bleeding in pregnancy, first trimester     Rx / DC Orders   ED Discharge Orders          Ordered    ondansetron (ZOFRAN-ODT) 4 MG disintegrating tablet  Every 8 hours PRN        03/14/22 1615             Note:  This document was prepared using Dragon voice recognition software and may include unintentional dictation errors.    Melvenia Needles, PA-C 03/14/22 1955    Lucillie Garfinkel, MD 03/14/22 1958

## 2022-03-14 NOTE — ED Triage Notes (Signed)
PT coming from home with abdominal cramping starting last night.  Pt is [redacted] weeks pregnant. Pt had some spotting as well. Pt endorsing pain still today and spotting.

## 2022-03-14 NOTE — Discharge Instructions (Addendum)
Your exam and labs are normal. Your ultrasound shows a normal pregnancy, measuring [redacted]w[redacted]d. Follow-up with your [facility] provider as discussed. Take OTC Tylenol as needed for pain. Start and complete the previously prescribed antibiotics.

## 2022-04-05 ENCOUNTER — Encounter: Payer: Medicaid Other | Admitting: Obstetrics and Gynecology

## 2022-04-05 ENCOUNTER — Other Ambulatory Visit: Payer: Medicaid Other

## 2022-04-09 ENCOUNTER — Encounter: Payer: Self-pay | Admitting: Obstetrics and Gynecology

## 2022-04-09 ENCOUNTER — Other Ambulatory Visit: Payer: Self-pay | Admitting: Licensed Practical Nurse

## 2022-04-09 ENCOUNTER — Other Ambulatory Visit: Payer: Medicaid Other

## 2022-04-09 ENCOUNTER — Ambulatory Visit (INDEPENDENT_AMBULATORY_CARE_PROVIDER_SITE_OTHER): Payer: Medicaid Other | Admitting: Obstetrics and Gynecology

## 2022-04-09 VITALS — BP 115/85 | HR 79 | Wt 249.2 lb

## 2022-04-09 DIAGNOSIS — Z3480 Encounter for supervision of other normal pregnancy, unspecified trimester: Secondary | ICD-10-CM

## 2022-04-09 DIAGNOSIS — Z1379 Encounter for other screening for genetic and chromosomal anomalies: Secondary | ICD-10-CM

## 2022-04-09 DIAGNOSIS — Z131 Encounter for screening for diabetes mellitus: Secondary | ICD-10-CM

## 2022-04-09 DIAGNOSIS — Z3482 Encounter for supervision of other normal pregnancy, second trimester: Secondary | ICD-10-CM

## 2022-04-09 DIAGNOSIS — Z3A15 15 weeks gestation of pregnancy: Secondary | ICD-10-CM

## 2022-04-09 NOTE — Progress Notes (Signed)
ROB: No complaints.  Taking vitamins but not aspirin-advised use of baby aspirin daily.  Early 1 hour GCT was scheduled for today.  Anatomy ultrasound with next visit.  At this point patient considering TOLAC -reports previous cesarean delivery for failure to dilate after 8 cm.  Baby 8 pounds 11 ounces.

## 2022-04-10 LAB — GLUCOSE TOLERANCE, 1 HOUR: Glucose, 1Hr PP: 95 mg/dL (ref 70–199)

## 2022-04-11 LAB — AFP, SERUM, OPEN SPINA BIFIDA
AFP MoM: 0.74
AFP Value: 19.2 ng/mL
Gest. Age on Collection Date: 15.6 weeks
Maternal Age At EDD: 27 yr
OSBR Risk 1 IN: 10000
Test Results:: NEGATIVE
Weight: 249 [lb_av]

## 2022-05-13 ENCOUNTER — Other Ambulatory Visit: Payer: Self-pay | Admitting: Obstetrics and Gynecology

## 2022-05-13 DIAGNOSIS — Z363 Encounter for antenatal screening for malformations: Secondary | ICD-10-CM

## 2022-05-14 ENCOUNTER — Ambulatory Visit (INDEPENDENT_AMBULATORY_CARE_PROVIDER_SITE_OTHER): Payer: Medicaid Other

## 2022-05-14 ENCOUNTER — Ambulatory Visit (INDEPENDENT_AMBULATORY_CARE_PROVIDER_SITE_OTHER): Payer: Medicaid Other | Admitting: Obstetrics & Gynecology

## 2022-05-14 VITALS — BP 109/73 | HR 91 | Wt 253.0 lb

## 2022-05-14 DIAGNOSIS — Z363 Encounter for antenatal screening for malformations: Secondary | ICD-10-CM

## 2022-05-14 DIAGNOSIS — Z3A20 20 weeks gestation of pregnancy: Secondary | ICD-10-CM

## 2022-05-14 DIAGNOSIS — O34219 Maternal care for unspecified type scar from previous cesarean delivery: Secondary | ICD-10-CM

## 2022-05-14 DIAGNOSIS — Z0000 Encounter for general adult medical examination without abnormal findings: Secondary | ICD-10-CM

## 2022-05-14 DIAGNOSIS — Z3480 Encounter for supervision of other normal pregnancy, unspecified trimester: Secondary | ICD-10-CM

## 2022-05-14 DIAGNOSIS — O2602 Excessive weight gain in pregnancy, second trimester: Secondary | ICD-10-CM

## 2022-05-14 DIAGNOSIS — O99210 Obesity complicating pregnancy, unspecified trimester: Secondary | ICD-10-CM

## 2022-05-14 NOTE — Progress Notes (Signed)
ROB. Patient states no questions or concerns at this time.  She is having her anatomy u/s today at 10:15 in office. No VB. No LOF.
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
## 2022-06-11 ENCOUNTER — Encounter: Payer: Self-pay | Admitting: Obstetrics and Gynecology

## 2022-06-11 ENCOUNTER — Ambulatory Visit (INDEPENDENT_AMBULATORY_CARE_PROVIDER_SITE_OTHER): Payer: Medicaid Other | Admitting: Obstetrics and Gynecology

## 2022-06-11 VITALS — BP 122/76 | HR 105 | Wt 257.4 lb

## 2022-06-11 DIAGNOSIS — R82998 Other abnormal findings in urine: Secondary | ICD-10-CM

## 2022-06-11 DIAGNOSIS — Z3A24 24 weeks gestation of pregnancy: Secondary | ICD-10-CM

## 2022-06-11 DIAGNOSIS — Z131 Encounter for screening for diabetes mellitus: Secondary | ICD-10-CM

## 2022-06-11 DIAGNOSIS — Z3480 Encounter for supervision of other normal pregnancy, unspecified trimester: Secondary | ICD-10-CM

## 2022-06-11 DIAGNOSIS — Z3482 Encounter for supervision of other normal pregnancy, second trimester: Secondary | ICD-10-CM

## 2022-06-11 DIAGNOSIS — Z113 Encounter for screening for infections with a predominantly sexual mode of transmission: Secondary | ICD-10-CM

## 2022-06-11 LAB — POCT URINALYSIS DIPSTICK OB
Glucose, UA: NEGATIVE
Nitrite, UA: POSITIVE
Spec Grav, UA: 1.01 (ref 1.010–1.025)
Urobilinogen, UA: 0.2 E.U./dL
pH, UA: 7 (ref 5.0–8.0)

## 2022-06-11 MED ORDER — NITROFURANTOIN MONOHYD MACRO 100 MG PO CAPS: 100.0000 mg | ORAL_CAPSULE | Freq: Two times a day (BID) | ORAL | 1 refills | Status: DC

## 2022-06-11 NOTE — Progress Notes (Signed)
ROB: Feeling daily movement but like flutters.  She has formally decided upon TOLAC.  UA consistent with UTI. -Macrobid given.  Alternative 50 g load with 1 hour GCT next visit.

## 2022-06-13 LAB — URINE CULTURE

## 2022-06-14 ENCOUNTER — Other Ambulatory Visit: Payer: Self-pay

## 2022-06-30 ENCOUNTER — Encounter: Payer: Self-pay | Admitting: Obstetrics and Gynecology

## 2022-07-03 ENCOUNTER — Encounter: Payer: Self-pay | Admitting: Obstetrics and Gynecology

## 2022-07-09 ENCOUNTER — Encounter: Payer: Self-pay | Admitting: Obstetrics and Gynecology

## 2022-07-09 ENCOUNTER — Other Ambulatory Visit: Payer: Medicaid Other

## 2022-07-09 ENCOUNTER — Ambulatory Visit (INDEPENDENT_AMBULATORY_CARE_PROVIDER_SITE_OTHER): Payer: Medicaid Other | Admitting: Obstetrics and Gynecology

## 2022-07-09 VITALS — BP 120/79 | HR 97 | Wt 261.6 lb

## 2022-07-09 DIAGNOSIS — Z3A28 28 weeks gestation of pregnancy: Secondary | ICD-10-CM

## 2022-07-09 DIAGNOSIS — Z3483 Encounter for supervision of other normal pregnancy, third trimester: Secondary | ICD-10-CM

## 2022-07-09 DIAGNOSIS — Z131 Encounter for screening for diabetes mellitus: Secondary | ICD-10-CM

## 2022-07-09 DIAGNOSIS — Z113 Encounter for screening for infections with a predominantly sexual mode of transmission: Secondary | ICD-10-CM

## 2022-07-09 DIAGNOSIS — Z3480 Encounter for supervision of other normal pregnancy, unspecified trimester: Secondary | ICD-10-CM

## 2022-07-09 DIAGNOSIS — Z23 Encounter for immunization: Secondary | ICD-10-CM

## 2022-07-09 DIAGNOSIS — O2343 Unspecified infection of urinary tract in pregnancy, third trimester: Secondary | ICD-10-CM

## 2022-07-09 DIAGNOSIS — R82998 Other abnormal findings in urine: Secondary | ICD-10-CM

## 2022-07-09 LAB — POCT URINALYSIS DIPSTICK OB
Bilirubin, UA: NEGATIVE
Glucose, UA: NEGATIVE
Ketones, UA: NEGATIVE
Nitrite, UA: NEGATIVE
POC,PROTEIN,UA: NEGATIVE
Spec Grav, UA: 1.015 (ref 1.010–1.025)
Urobilinogen, UA: 0.2 E.U./dL
pH, UA: 6 (ref 5.0–8.0)

## 2022-07-09 MED ORDER — SULFAMETHOXAZOLE-TRIMETHOPRIM 800-160 MG PO TABS: 1.0000 | ORAL_TABLET | Freq: Two times a day (BID) | ORAL | 0 refills | Status: DC

## 2022-07-09 NOTE — Progress Notes (Signed)
ROB: She is generally doing well.  She still has some UTI type symptoms which seems consistent with a urine dip.  Urine sent for culture-will change antibiotics to Bactrim to see if it makes a difference.  Consider prophylactic use of Macrobid after Bactrim for remainder of pregnancy.  Patient reports daily fetal movement-very active.  1 hour GCT today.  Reaffirmed her desire for TOLAC.

## 2022-07-09 NOTE — Progress Notes (Signed)
ROB. Patient states daily fetal movement with pelvic and back pressure. She states this morning having some pain on her left breast and nipple area, pain has resolved. Patient completed alternative GCT (2 small cokes) and signed BTC today. Declined TDAP today, information given and she will consider it for her next visit. Patient states no questions or concerns at this time.

## 2022-07-10 ENCOUNTER — Encounter: Payer: Self-pay | Admitting: Obstetrics and Gynecology

## 2022-07-10 LAB — 28 WEEK RH+PANEL
Basophils Absolute: 0 10*3/uL (ref 0.0–0.2)
Basos: 0 %
EOS (ABSOLUTE): 0.2 10*3/uL (ref 0.0–0.4)
Eos: 2 %
Gestational Diabetes Screen: 73 mg/dL (ref 70–139)
HIV Screen 4th Generation wRfx: NONREACTIVE
Hematocrit: 34.2 % (ref 34.0–46.6)
Hemoglobin: 11.6 g/dL (ref 11.1–15.9)
Immature Grans (Abs): 0.1 10*3/uL (ref 0.0–0.1)
Immature Granulocytes: 1 %
Lymphocytes Absolute: 2.4 10*3/uL (ref 0.7–3.1)
Lymphs: 22 %
MCH: 28.9 pg (ref 26.6–33.0)
MCHC: 33.9 g/dL (ref 31.5–35.7)
MCV: 85 fL (ref 79–97)
Monocytes Absolute: 1.1 10*3/uL — ABNORMAL HIGH (ref 0.1–0.9)
Monocytes: 10 %
Neutrophils Absolute: 7.2 10*3/uL — ABNORMAL HIGH (ref 1.4–7.0)
Neutrophils: 65 %
Platelets: 222 10*3/uL (ref 150–450)
RBC: 4.01 x10E6/uL (ref 3.77–5.28)
RDW: 13.2 % (ref 11.7–15.4)
RPR Ser Ql: NONREACTIVE
WBC: 10.9 10*3/uL — ABNORMAL HIGH (ref 3.4–10.8)

## 2022-07-12 LAB — URINE CULTURE

## 2022-07-31 ENCOUNTER — Encounter: Payer: Self-pay | Admitting: Obstetrics and Gynecology

## 2022-07-31 ENCOUNTER — Ambulatory Visit (INDEPENDENT_AMBULATORY_CARE_PROVIDER_SITE_OTHER): Payer: Medicaid Other | Admitting: Obstetrics and Gynecology

## 2022-07-31 VITALS — BP 110/72 | HR 106 | Wt 265.8 lb

## 2022-07-31 DIAGNOSIS — Z3480 Encounter for supervision of other normal pregnancy, unspecified trimester: Secondary | ICD-10-CM

## 2022-07-31 DIAGNOSIS — Z3A31 31 weeks gestation of pregnancy: Secondary | ICD-10-CM

## 2022-07-31 DIAGNOSIS — Z3483 Encounter for supervision of other normal pregnancy, third trimester: Secondary | ICD-10-CM

## 2022-07-31 LAB — POCT URINALYSIS DIPSTICK OB
Bilirubin, UA: NEGATIVE
Blood, UA: NEGATIVE
Glucose, UA: NEGATIVE
Ketones, UA: NEGATIVE
Nitrite, UA: NEGATIVE
POC,PROTEIN,UA: NEGATIVE
Spec Grav, UA: 1.025 (ref 1.010–1.025)
Urobilinogen, UA: 0.2 E.U./dL
pH, UA: 6 (ref 5.0–8.0)

## 2022-07-31 NOTE — Progress Notes (Signed)
ROB. Patient states daily fetal movement with occasional pressure. She states feeling much better after last round of antibiotics for UTI. She states no questions or concerns at this time.

## 2022-07-31 NOTE — Progress Notes (Signed)
ROB: Feels well.  Occasional [place] [address].  Reaffirmed her desire for TOLAC.Marland Kitchen  Reports daily fetal movement.  UTI resolved using Bactrim.

## 2022-08-08 ENCOUNTER — Encounter: Payer: Self-pay | Admitting: Obstetrics and Gynecology

## 2022-08-15 ENCOUNTER — Ambulatory Visit (INDEPENDENT_AMBULATORY_CARE_PROVIDER_SITE_OTHER): Payer: Medicaid Other | Admitting: Obstetrics & Gynecology

## 2022-08-15 VITALS — BP 111/70 | HR 103 | Wt 264.0 lb

## 2022-08-15 DIAGNOSIS — O34219 Maternal care for unspecified type scar from previous cesarean delivery: Secondary | ICD-10-CM

## 2022-08-15 DIAGNOSIS — Z3480 Encounter for supervision of other normal pregnancy, unspecified trimester: Secondary | ICD-10-CM

## 2022-08-15 DIAGNOSIS — O2243 Hemorrhoids in pregnancy, third trimester: Secondary | ICD-10-CM

## 2022-08-15 DIAGNOSIS — Z3A33 33 weeks gestation of pregnancy: Secondary | ICD-10-CM

## 2022-08-15 DIAGNOSIS — O2603 Excessive weight gain in pregnancy, third trimester: Secondary | ICD-10-CM

## 2022-08-15 DIAGNOSIS — O99210 Obesity complicating pregnancy, unspecified trimester: Secondary | ICD-10-CM

## 2022-08-15 DIAGNOSIS — Z3A34 34 weeks gestation of pregnancy: Secondary | ICD-10-CM

## 2022-08-15 DIAGNOSIS — E669 Obesity, unspecified: Secondary | ICD-10-CM

## 2022-08-15 LAB — POCT URINALYSIS DIPSTICK OB
Bilirubin, UA: NEGATIVE
Blood, UA: NEGATIVE
Glucose, UA: NEGATIVE
Ketones, UA: NEGATIVE
Nitrite, UA: NEGATIVE
Spec Grav, UA: 1.01 (ref 1.010–1.025)
Urobilinogen, UA: 1 E.U./dL
pH, UA: 7 (ref 5.0–8.0)

## 2022-08-15 MED ORDER — HYDROCORTISONE ACETATE 25 MG RE SUPP: 25.0000 mg | Freq: Two times a day (BID) | RECTAL | 1 refills | Status: DC
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
## 2022-08-15 NOTE — Addendum Note (Signed)
Addended by: Quintella Baton D on: 08/15/2022 03:12 PM   Modules accepted: Orders

## 2022-08-28 ENCOUNTER — Other Ambulatory Visit (HOSPITAL_COMMUNITY)
Admission: RE | Admit: 2022-08-28 | Discharge: 2022-08-28 | Disposition: A | Discharge status: Home / Self Care | Payer: Medicaid Other | Source: Ambulatory Visit | Attending: Certified Nurse Midwife | Admitting: Certified Nurse Midwife

## 2022-08-28 ENCOUNTER — Encounter: Payer: Self-pay | Admitting: Certified Nurse Midwife

## 2022-08-28 ENCOUNTER — Ambulatory Visit (INDEPENDENT_AMBULATORY_CARE_PROVIDER_SITE_OTHER): Payer: Medicaid Other | Admitting: Certified Nurse Midwife

## 2022-08-28 ENCOUNTER — Telehealth: Payer: Self-pay

## 2022-08-28 VITALS — BP 108/73 | HR 118 | Wt 271.1 lb

## 2022-08-28 DIAGNOSIS — Z113 Encounter for screening for infections with a predominantly sexual mode of transmission: Secondary | ICD-10-CM | POA: Diagnosis present

## 2022-08-28 DIAGNOSIS — R82998 Other abnormal findings in urine: Secondary | ICD-10-CM

## 2022-08-28 DIAGNOSIS — Z3483 Encounter for supervision of other normal pregnancy, third trimester: Secondary | ICD-10-CM

## 2022-08-28 DIAGNOSIS — Z3480 Encounter for supervision of other normal pregnancy, unspecified trimester: Secondary | ICD-10-CM

## 2022-08-28 DIAGNOSIS — Z3A35 35 weeks gestation of pregnancy: Secondary | ICD-10-CM

## 2022-08-28 DIAGNOSIS — Z3685 Encounter for antenatal screening for Streptococcus B: Secondary | ICD-10-CM

## 2022-08-28 LAB — POCT URINALYSIS DIPSTICK OB
Bilirubin, UA: NEGATIVE
Glucose, UA: NEGATIVE
Ketones, UA: NEGATIVE
Spec Grav, UA: 1.01 (ref 1.010–1.025)
Urobilinogen, UA: 0.2 E.U./dL
pH, UA: 7 (ref 5.0–8.0)

## 2022-08-28 MED ORDER — HYDROCORTISONE 2.5 % EX CREA: TOPICAL_CREAM | Freq: Three times a day (TID) | CUTANEOUS | 0 refills | Status: AC | PRN

## 2022-08-28 NOTE — Progress Notes (Addendum)
ROB doing well, feeling good movement. Pt completed self swab today. Urine dip positive for UTI. Culture sent, orders placed for Macrobid. Pt state she had some itching but it has resolved. Discussed common causes , self help measures and signs and symptoms to notify provider. She verbalizes understanding . Encouraged benadryl prn. She agrees. Pt has had excessive weight gain in pregnancy 91 lbs.  Dr. Hulan Fray discussed weight gain with pt last visit and ordered growth u/s. Will follow up with getting it scheduled. Follow up 1 wk for ROB.   Philip Aspen, CNM

## 2022-08-28 NOTE — Telephone Encounter (Signed)
Left message for patient to call office back, urine dip done in office showed patient was positive for leukocytes and nitrates. Urine was sent for culture and antibiotic sent to pharmacy. KW

## 2022-08-28 NOTE — Patient Instructions (Signed)

## 2022-08-28 NOTE — Telephone Encounter (Signed)
Also it was noted at todays visit about weight gain during pregnancy, after reviewing past records it appears that when patient was seen on 01/25/22 it was noted her weight was 237, according to Philip Aspen patient reported that her pregravid weight was 180-190lbs.

## 2022-08-29 NOTE — Telephone Encounter (Signed)
Spoke with pt

## 2022-08-30 ENCOUNTER — Ambulatory Visit
Admission: RE | Admit: 2022-08-30 | Discharge: 2022-08-30 | Disposition: A | Discharge status: Home / Self Care | Payer: Medicaid Other | Source: Ambulatory Visit | Attending: Obstetrics & Gynecology | Admitting: Obstetrics & Gynecology

## 2022-08-30 DIAGNOSIS — O99210 Obesity complicating pregnancy, unspecified trimester: Secondary | ICD-10-CM | POA: Insufficient documentation

## 2022-08-30 DIAGNOSIS — O358XX0 Maternal care for other (suspected) fetal abnormality and damage, not applicable or unspecified: Secondary | ICD-10-CM | POA: Insufficient documentation

## 2022-08-30 DIAGNOSIS — Z3A34 34 weeks gestation of pregnancy: Secondary | ICD-10-CM | POA: Insufficient documentation

## 2022-08-30 DIAGNOSIS — O2603 Excessive weight gain in pregnancy, third trimester: Secondary | ICD-10-CM | POA: Diagnosis present

## 2022-08-30 DIAGNOSIS — E669 Obesity, unspecified: Secondary | ICD-10-CM | POA: Diagnosis not present

## 2022-08-30 LAB — CERVICOVAGINAL ANCILLARY ONLY
Chlamydia: POSITIVE — AB
Comment: NEGATIVE
Comment: NORMAL
Neisseria Gonorrhea: NEGATIVE

## 2022-08-30 LAB — URINE CULTURE

## 2022-08-30 LAB — STREP GP B NAA: Strep Gp B NAA: NEGATIVE

## 2022-08-31 ENCOUNTER — Encounter: Payer: Self-pay | Admitting: Obstetrics

## 2022-08-31 ENCOUNTER — Other Ambulatory Visit: Payer: Self-pay | Admitting: Obstetrics

## 2022-08-31 MED ORDER — AZITHROMYCIN 500 MG PO TABS: 1000.0000 mg | ORAL_TABLET | Freq: Once | ORAL | 0 refills | Status: AC

## 2022-08-31 NOTE — Progress Notes (Signed)
+  chlamydia on routine swab. Rx sent for azithromycin. Chelbi notified via MyChart and informed that partner also needs to be treated.  M. Norberto Sorenson, CNM

## 2022-09-02 ENCOUNTER — Telehealth: Payer: Self-pay

## 2022-09-02 NOTE — Telephone Encounter (Signed)
Diane from [facility] radiology contacted office to confirm if we have seen US OB COMP +14wks dated on 08/29/21. Diane request that this be reviewed by Dr. Hulan Fray right away. Dr. Hulan Fray is out office please review report and advise. Thanks. KW

## 2022-09-06 ENCOUNTER — Encounter: Payer: Self-pay | Admitting: Medical

## 2022-09-06 ENCOUNTER — Ambulatory Visit (INDEPENDENT_AMBULATORY_CARE_PROVIDER_SITE_OTHER): Payer: Medicaid Other | Admitting: Medical

## 2022-09-06 VITALS — BP 103/71 | HR 108 | Wt 268.9 lb

## 2022-09-06 DIAGNOSIS — O2603 Excessive weight gain in pregnancy, third trimester: Secondary | ICD-10-CM

## 2022-09-06 DIAGNOSIS — O34219 Maternal care for unspecified type scar from previous cesarean delivery: Secondary | ICD-10-CM

## 2022-09-06 DIAGNOSIS — O98313 Other infections with a predominantly sexual mode of transmission complicating pregnancy, third trimester: Secondary | ICD-10-CM

## 2022-09-06 DIAGNOSIS — Z3480 Encounter for supervision of other normal pregnancy, unspecified trimester: Secondary | ICD-10-CM

## 2022-09-06 DIAGNOSIS — Z3A37 37 weeks gestation of pregnancy: Secondary | ICD-10-CM

## 2022-09-06 DIAGNOSIS — N390 Urinary tract infection, site not specified: Secondary | ICD-10-CM

## 2022-09-06 DIAGNOSIS — O2343 Unspecified infection of urinary tract in pregnancy, third trimester: Secondary | ICD-10-CM

## 2022-09-06 DIAGNOSIS — E669 Obesity, unspecified: Secondary | ICD-10-CM

## 2022-09-06 DIAGNOSIS — O26833 Pregnancy related renal disease, third trimester: Secondary | ICD-10-CM

## 2022-09-06 DIAGNOSIS — N1330 Unspecified hydronephrosis: Secondary | ICD-10-CM

## 2022-09-06 DIAGNOSIS — O99213 Obesity complicating pregnancy, third trimester: Secondary | ICD-10-CM

## 2022-09-06 DIAGNOSIS — A749 Chlamydial infection, unspecified: Secondary | ICD-10-CM

## 2022-09-06 DIAGNOSIS — O99210 Obesity complicating pregnancy, unspecified trimester: Secondary | ICD-10-CM

## 2022-09-06 NOTE — Patient Instructions (Signed)
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
## 2022-09-10 ENCOUNTER — Encounter: Payer: Self-pay | Admitting: Medical

## 2022-09-11 LAB — URINE CULTURE

## 2022-09-12 ENCOUNTER — Ambulatory Visit (INDEPENDENT_AMBULATORY_CARE_PROVIDER_SITE_OTHER): Payer: Medicaid Other | Admitting: Obstetrics

## 2022-09-12 VITALS — BP 111/79 | HR 112 | Wt 271.0 lb

## 2022-09-12 DIAGNOSIS — Z3A37 37 weeks gestation of pregnancy: Secondary | ICD-10-CM

## 2022-09-12 DIAGNOSIS — Z3480 Encounter for supervision of other normal pregnancy, unspecified trimester: Secondary | ICD-10-CM

## 2022-09-12 DIAGNOSIS — A568 Sexually transmitted chlamydial infection of other sites: Secondary | ICD-10-CM

## 2022-09-12 DIAGNOSIS — O98313 Other infections with a predominantly sexual mode of transmission complicating pregnancy, third trimester: Secondary | ICD-10-CM

## 2022-09-12 LAB — POCT URINALYSIS DIPSTICK OB
Bilirubin, UA: NEGATIVE
Blood, UA: NEGATIVE
Glucose, UA: NEGATIVE
Ketones, UA: NEGATIVE
Leukocytes, UA: NEGATIVE
Nitrite, UA: NEGATIVE
POC,PROTEIN,UA: NEGATIVE
Spec Grav, UA: 1.01 (ref 1.010–1.025)
Urobilinogen, UA: 0.2 E.U./dL
pH, UA: 7.5 (ref 5.0–8.0)

## 2022-09-12 MED ORDER — CEPHALEXIN 500 MG PO CAPS
500.0000 mg | ORAL_CAPSULE | Freq: Four times a day (QID) | ORAL | 0 refills | Status: DC
Start: 2022-09-12 — End: 2022-09-25
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
## 2022-09-12 NOTE — Addendum Note (Signed)
Addended by: Marny Lowenstein on: 09/12/2022 02:23 PM   Modules accepted: Orders

## 2022-09-19 ENCOUNTER — Ambulatory Visit (INDEPENDENT_AMBULATORY_CARE_PROVIDER_SITE_OTHER): Payer: Medicaid Other

## 2022-09-19 ENCOUNTER — Other Ambulatory Visit (HOSPITAL_COMMUNITY)
Admission: RE | Admit: 2022-09-19 | Discharge: 2022-09-19 | Disposition: A | Discharge status: Home / Self Care | Payer: Medicaid Other | Source: Ambulatory Visit | Attending: Licensed Practical Nurse | Admitting: Licensed Practical Nurse

## 2022-09-19 ENCOUNTER — Ambulatory Visit (INDEPENDENT_AMBULATORY_CARE_PROVIDER_SITE_OTHER): Payer: Medicaid Other | Admitting: Licensed Practical Nurse

## 2022-09-19 ENCOUNTER — Encounter: Payer: Self-pay | Admitting: Licensed Practical Nurse

## 2022-09-19 VITALS — BP 114/83 | HR 101 | Ht 67.0 in | Wt 272.9 lb

## 2022-09-19 VITALS — BP 114/83 | HR 101 | Wt 272.9 lb

## 2022-09-19 DIAGNOSIS — Z3A38 38 weeks gestation of pregnancy: Secondary | ICD-10-CM

## 2022-09-19 DIAGNOSIS — E669 Obesity, unspecified: Secondary | ICD-10-CM

## 2022-09-19 DIAGNOSIS — Z3483 Encounter for supervision of other normal pregnancy, third trimester: Secondary | ICD-10-CM

## 2022-09-19 DIAGNOSIS — Z6841 Body Mass Index (BMI) 40.0 and over, adult: Secondary | ICD-10-CM

## 2022-09-19 DIAGNOSIS — Z113 Encounter for screening for infections with a predominantly sexual mode of transmission: Secondary | ICD-10-CM | POA: Diagnosis not present

## 2022-09-19 DIAGNOSIS — O99213 Obesity complicating pregnancy, third trimester: Secondary | ICD-10-CM | POA: Diagnosis not present

## 2022-09-19 LAB — POCT URINALYSIS DIPSTICK
Bilirubin, UA: NEGATIVE
Blood, UA: NEGATIVE
Glucose, UA: NEGATIVE
Ketones, UA: NEGATIVE
Leukocytes, UA: NEGATIVE
Nitrite, UA: NEGATIVE
Protein, UA: NEGATIVE
Spec Grav, UA: 1.015 (ref 1.010–1.025)
Urobilinogen, UA: 1 E.U./dL
pH, UA: 7 (ref 5.0–8.0)

## 2022-09-19 NOTE — Progress Notes (Signed)
    NURSE VISIT NOTE  Subjective:    Patient ID: Andrea Chang, female    DOB: 09/03/1995, 27 y.o.   MRN: [id number]  HPI  Patient is a 27 y.o. G6P1021 female who presents for fetal monitoring per order from Carie Caddy, [place].   Objective:    BP 114/83   Pulse (!) 101   Ht  (1.702 m)   Wt 272 lb 14.4 oz (123.8 kg)   LMP 12/11/2021 (Exact Date)   BMI 42.74 kg/m  Estimated Date of Delivery: 09/27/22  [redacted]w[redacted]d  Fetus A Non-Stress Test Interpretation for 09/19/22  Indication: Obesity  Fetal Heart Rate A Mode: External Baseline Rate (A): 145 bpm Variability: Moderate Accelerations: 15 x 15 Decelerations: None Multiple birth?: No  Uterine Activity Mode: Toco Contraction Frequency (min): Rare Contraction Duration (sec): 60 Resting Time: Adequate  Interpretation (Fetal Testing) Nonstress Test Interpretation: Reactive Overall Impression: Reassuring for gestational age   Assessment:   1. Obesity affecting pregnancy in third trimester, unspecified obesity type   2. [redacted] weeks gestation of pregnancy      Plan:   Results reviewed and discussed with patient by  Carie Caddy, CNM.     Rocco Serene, LPN
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
## 2022-09-20 LAB — CERVICOVAGINAL ANCILLARY ONLY
Chlamydia: NEGATIVE
Comment: NEGATIVE
Comment: NEGATIVE
Comment: NORMAL
Neisseria Gonorrhea: NEGATIVE
Trichomonas: NEGATIVE

## 2022-09-21 ENCOUNTER — Encounter: Payer: Self-pay | Admitting: Licensed Practical Nurse

## 2022-09-21 NOTE — Progress Notes (Signed)
  [facility] INDUCTION ASSESSMENT Andrea Chang May 07, 1996 Medical record #: [id number] Phone #:  Home Phone [phone]  Mobile [phone]    Prenatal Provider: [facility] Delivering Group: [facility] Proposed admission date/time:09/27/2022 0500 Method of induction: FB, pitocin   Weight:  123.8 KG  BMI  42.7 HIV Negative HSV Negative EDC Estimated Date of Delivery: 4/26/24based on:US at [redacted] wks  Gestational age on admission: 67 Gravidity/parity:G4P1021  Cervix Score   0 Position Posterior Midposition Anterior   Consistency Firm Medium Soft   Effacement (%) 0-30 40-50 60-70 >80  Dilation (cm) Closed 1-2 3-4 >5  Baby's station -3 -2 -1 +1, +2   Bishop Score:3   Medical induction of labor  select indication(s) below Elective induction ?39 weeks multiparous patient ?39 weeks primiparous patient with Bishop score ?7 ?40 weeks primiparous patient   Medical Indications Adapted from ACOG Committee Opinion #[id number], "Medically Indicated Late Preterm and Early Term Deliveries," 2013.  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 (41 weeks) Mo-Di isolated (32.0-34.6) Pregestational, vascular compromise (37.0- 39.0)  PPROM (34.0) Multiple Gestation Gestational, diet controlled (40.0)  Hx  of IUFD (39.0 weeks) Di-Di (38.0-38.6) Gestational, med controlled (39.0)  Polyhydramnios, mild/moderate; SDV 8-16 or AFI 25-35 (39.0) Mo-Di (36.0-37.6) Gestational, uncontrolled (38.0-39.0)  Oligohydramnios (36.0-37.6); MVP <2 cm  For indications not listed above, delivery recommendations from maternal-fetal medicine consultant occurred on: Date: with Dr.  for indication of: High bMI,  pt desires TOLAC  Provider Signature: Ellouise Newer [facility] Scheduled by:L and D  Date:09/21/2022 12:01 PM   Call [phone] to finalize the induction date/time  [id number] (07/17)

## 2022-09-23 ENCOUNTER — Encounter: Payer: Self-pay | Admitting: Licensed Practical Nurse

## 2022-09-25 ENCOUNTER — Encounter: Payer: Self-pay | Admitting: Obstetrics and Gynecology

## 2022-09-25 ENCOUNTER — Ambulatory Visit (INDEPENDENT_AMBULATORY_CARE_PROVIDER_SITE_OTHER): Payer: Medicaid Other | Admitting: Obstetrics

## 2022-09-25 ENCOUNTER — Other Ambulatory Visit: Payer: Self-pay

## 2022-09-25 ENCOUNTER — Observation Stay
Admission: EM | Admit: 2022-09-25 | Discharge: 2022-09-25 | Disposition: A | Discharge status: Home / Self Care | Payer: Medicaid Other | Attending: Obstetrics and Gynecology | Admitting: Obstetrics and Gynecology

## 2022-09-25 VITALS — BP 113/84 | HR 97 | Wt 274.0 lb

## 2022-09-25 DIAGNOSIS — Z3480 Encounter for supervision of other normal pregnancy, unspecified trimester: Secondary | ICD-10-CM

## 2022-09-25 DIAGNOSIS — Z3A39 39 weeks gestation of pregnancy: Secondary | ICD-10-CM | POA: Diagnosis not present

## 2022-09-25 DIAGNOSIS — Z3483 Encounter for supervision of other normal pregnancy, third trimester: Secondary | ICD-10-CM

## 2022-09-25 DIAGNOSIS — O368330 Maternal care for abnormalities of the fetal heart rate or rhythm, third trimester, not applicable or unspecified: Secondary | ICD-10-CM | POA: Diagnosis present

## 2022-09-25 DIAGNOSIS — Z79899 Other long term (current) drug therapy: Secondary | ICD-10-CM | POA: Diagnosis not present

## 2022-09-25 DIAGNOSIS — Z87891 Personal history of nicotine dependence: Secondary | ICD-10-CM | POA: Insufficient documentation

## 2022-09-25 MED ORDER — FLUTICASONE PROPIONATE 50 MCG/ACT NA SUSP: 2.0000 | Freq: Every day | NASAL | 2 refills | Status: DC

## 2022-09-25 MED ORDER — ACETAMINOPHEN 500 MG PO TABS
1000.0000 mg | ORAL_TABLET | Freq: Four times a day (QID) | ORAL | Status: DC | PRN
  Administered 2022-09-25: 1000 mg via ORAL
  Filled 2022-09-25: qty 2

## 2022-09-25 MED ORDER — LORATADINE 10 MG PO TABS: 10.0000 mg | ORAL_TABLET | Freq: Every day | ORAL | 3 refills | Status: DC

## 2022-09-25 NOTE — Progress Notes (Signed)
ROB at [redacted]w[redacted]d.  Active baby. Having some ctx. Chlamydia TOC negative. Dana is having trouble sleeping d/t allergy congestion. Rx for loratadine and Flonase sent. IOL is scheduled for 09/24/22 at 0500. Reviewed routines, what to expect, when to arrive. Declines SVE. NRNST today. Will go to L&D for extended monitoring and BPP.  Glenetta Borg, CNM

## 2022-09-25 NOTE — Discharge Summary (Signed)
Physician Final Progress Note  Patient ID: Andrea Chang MRN: [id number] DOB/AGE: [birth year] 27 y.o.  Admit date: 09/25/2022 Admitting provider: Linzie Collin, MD Discharge date: 09/25/2022   Admission Diagnoses:  1) intrauterine pregnancy at [redacted]w[redacted]d  2) non reactive NST  Discharge Diagnoses:  Active Problems:   * No active hospital problems. *  Reactive NST   History of Present Illness: The patient is a 27 y.o. female [phone] at [redacted]w[redacted]d who presents for monitoring. She was seen in the office today where she had a NRNST. Pt endorses +FM but admits it is not as intense as normal and not as frequent as she is used to. She has felt a few contractions. Denies LOF./VB.  Past Medical History:  Diagnosis Date   Bartholin cyst    Chlamydia 2013   Ectopic pregnancy    Iron deficiency anemia    Menometrorrhagia    Missed abortion    Normal cardiac stress test    Renal disorder    Sepsis    Vaginal discharge     Past Surgical History:  Procedure Laterality Date   CESAREAN SECTION     DIAGNOSTIC LAPAROSCOPY     DILATION AND CURETTAGE, DIAGNOSTIC / THERAPEUTIC     ECTOPIC PREGNANCY SURGERY     WISDOM TOOTH EXTRACTION     all four; ~age [age]    No current facility-administered medications on file prior to encounter.   Current Outpatient Medications on File Prior to Encounter  Medication Sig Dispense Refill   cephALEXin (KEFLEX) 500 MG capsule Take 1 capsule (500 mg total) by mouth 4 (four) times daily. 28 capsule 0   Prenatal MV & Min w/FA-DHA (PRENATAL GUMMIES PO) Take by mouth.     fluticasone (FLONASE) 50 MCG/ACT nasal spray Place 2 sprays into both nostrils daily. 11.1 mL 2   hydrocortisone 2.5 % cream Apply topically 3 (three) times daily as needed for up to 7 days. 30 g 0   loratadine (CLARITIN) 10 MG tablet Take 1 tablet (10 mg total) by mouth daily. 28 tablet 3    No Known Allergies  Social History   Socioeconomic History   Marital status: Single    Spouse  name: Not on file   Number of children: 1   Years of education: 12   Highest education level: Not on file  Occupational History   Occupation: [employer]  Tobacco Use   Smoking status: Former    Types: Cigarettes, Cigars    Quit date: 01/04/2022    Years since quitting: 0.7   Smokeless tobacco: Never   Tobacco comments:    Stopped smoking when found out preg  Vaping Use   Vaping Use: Never used  Substance and Sexual Activity   Alcohol use: Not Currently    Alcohol/week: 3.0 standard drinks of alcohol    Types: 2 Glasses of wine, 1 Standard drinks or equivalent per week    Comment: last use- 2 weeks ago (01/2022) "liquor"   Drug use: Not Currently    Types: Marijuana    Comment: last use 12/2021   Sexual activity: Yes    Partners: Male    Birth control/protection: None  Other Topics Concern   Not on file  Social History Narrative   Not on file   Social Determinants of Health   Financial Resource Strain: Low Risk  (03/04/2022)   Overall Financial Resource Strain (CARDIA)    Difficulty of Paying Living Expenses: Not hard at all  Food Insecurity: No Food Insecurity (  03/04/2022)   Hunger Vital Sign    Worried About Running Out of Food in the Last Year: Never true    Ran Out of Food in the Last Year: Never true  Transportation Needs: No Transportation Needs (03/04/2022)   PRAPARE - Administrator, Civil Service (Medical): No    Lack of Transportation (Non-Medical): No  Physical Activity: Sufficiently Active (03/04/2022)   Exercise Vital Sign    Days of Exercise per Week: 3 days    Minutes of Exercise per Session: 150+ min  Stress: No Stress Concern Present (03/04/2022)   [employer] of Occupational Health - Occupational Stress Questionnaire    Feeling of Stress : Not at all  Social Connections: Unknown (03/04/2022)   Social Connection and Isolation Panel [NHANES]    Frequency of Communication with Friends and Family: More than three times a week    Frequency of  Social Gatherings with Friends and Family: More than three times a week    Attends Religious Services: More than 4 times per year    Active Member of [employer] or Organizations: No    Attends Banker Meetings: Never    Marital Status: Not on file  Intimate Partner Violence: Not At Risk (03/04/2022)   Humiliation, Afraid, Rape, and Kick questionnaire    Fear of Current or Ex-Partner: No    Emotionally Abused: No    Physically Abused: No    Sexually Abused: No    Family History  Problem Relation Age of Onset   Diabetes Mother    Hypertension Mother    Hypertension Father    Colon cancer Maternal Grandfather      ROS see HPI   Physical Exam: BP 115/71 (BP Location: Right Leg)   Pulse (!) 123   Temp 98.2 F (36.8 C) (Oral)   Resp 18   LMP 12/11/2021 (Exact Date)   Physical Exam Constitutional:      Appearance: Normal appearance.  Genitourinary:     Vulva normal.  Pulmonary:     Effort: Pulmonary effort is normal.  Abdominal:     Tenderness: There is no abdominal tenderness.     Comments: gravid  Musculoskeletal:     Cervical back: Normal range of motion.     Right lower leg: No edema.     Left lower leg: No edema.  Neurological:     General: No focal deficit present.     Mental Status: She is alert.  Skin:    General: Skin is warm.  Psychiatric:        Mood and Affect: Mood normal.   SVE FT/thick/-3 EFM: baseline 155, moderate variability, pos accel, neg decel.   Consults: None  Significant Findings/ Diagnostic Studies: NA  Procedures: RNST   Hospital Course: The patient was admitted to Labor and Delivery Triage for observation. She given some water and juice to drink. She had a RNST. She is not in labor. Pt was discharged home with instructions to return on 4/26 for her scheduled IOL.   Discharge Condition: good  Disposition: Discharge disposition: 01-Home or Self Care       Diet: Regular diet  Discharge Activity: Activity as  tolerated   Allergies as of 09/25/2022   No Known Allergies      Medication List     STOP taking these medications    cephALEXin 500 MG capsule Commonly known as: Keflex       TAKE these medications    fluticasone 50 MCG/ACT nasal  spray Commonly known as: FLONASE Place 2 sprays into both nostrils daily.   hydrocortisone 2.5 % cream Apply topically 3 (three) times daily as needed for up to 7 days.   loratadine 10 MG tablet Commonly known as: Claritin Take 1 tablet (10 mg total) by mouth daily.   PRENATAL GUMMIES PO Take by mouth.         Total time spent taking care of this patient: 20 minutes  Signed: Ellouise Newer [facility], CNM  09/25/2022, 3:14 PM

## 2022-09-25 NOTE — OB Triage Note (Signed)
Patient arrived to unit via wheelchair for an NST and BPP. Patient denies vaginal bleeding, LOF, epigastric pain, or decreased FM. Patient states that she currently has a HA but has not taken Tylenol to see if it eases the pain. EFM and TOCO will be explained and applied to soft, non-tender abdomen. CNM will be made aware of patient's arrival.

## 2022-09-26 ENCOUNTER — Encounter: Payer: Self-pay | Admitting: Obstetrics

## 2022-09-27 ENCOUNTER — Encounter: Payer: Self-pay | Admitting: Obstetrics and Gynecology

## 2022-09-27 ENCOUNTER — Other Ambulatory Visit: Payer: Self-pay

## 2022-09-27 ENCOUNTER — Inpatient Hospital Stay
Admission: EM | Admit: 2022-09-27 | Discharge: 2022-09-30 | DRG: 788 | Disposition: A | Discharge status: Home / Self Care | Payer: Medicaid Other | Attending: Certified Nurse Midwife | Admitting: Certified Nurse Midwife

## 2022-09-27 ENCOUNTER — Inpatient Hospital Stay: Payer: Medicaid Other | Admitting: Anesthesiology

## 2022-09-27 DIAGNOSIS — O34211 Maternal care for low transverse scar from previous cesarean delivery: Secondary | ICD-10-CM | POA: Diagnosis present

## 2022-09-27 DIAGNOSIS — O99214 Obesity complicating childbirth: Principal | ICD-10-CM | POA: Diagnosis present

## 2022-09-27 DIAGNOSIS — F1290 Cannabis use, unspecified, uncomplicated: Secondary | ICD-10-CM | POA: Diagnosis present

## 2022-09-27 DIAGNOSIS — O480 Post-term pregnancy: Secondary | ICD-10-CM | POA: Diagnosis present

## 2022-09-27 DIAGNOSIS — O324XX0 Maternal care for high head at term, not applicable or unspecified: Secondary | ICD-10-CM | POA: Diagnosis present

## 2022-09-27 DIAGNOSIS — O35EXX0 Maternal care for other (suspected) fetal abnormality and damage, fetal genitourinary anomalies, not applicable or unspecified: Secondary | ICD-10-CM | POA: Diagnosis present

## 2022-09-27 DIAGNOSIS — O99324 Drug use complicating childbirth: Secondary | ICD-10-CM | POA: Diagnosis present

## 2022-09-27 DIAGNOSIS — A749 Chlamydial infection, unspecified: Secondary | ICD-10-CM | POA: Diagnosis not present

## 2022-09-27 DIAGNOSIS — Z113 Encounter for screening for infections with a predominantly sexual mode of transmission: Secondary | ICD-10-CM

## 2022-09-27 DIAGNOSIS — O358XX0 Maternal care for other (suspected) fetal abnormality and damage, not applicable or unspecified: Secondary | ICD-10-CM | POA: Diagnosis not present

## 2022-09-27 DIAGNOSIS — O98313 Other infections with a predominantly sexual mode of transmission complicating pregnancy, third trimester: Secondary | ICD-10-CM | POA: Diagnosis not present

## 2022-09-27 DIAGNOSIS — O9902 Anemia complicating childbirth: Secondary | ICD-10-CM | POA: Diagnosis present

## 2022-09-27 DIAGNOSIS — Z87891 Personal history of nicotine dependence: Secondary | ICD-10-CM

## 2022-09-27 DIAGNOSIS — Z3A40 40 weeks gestation of pregnancy: Secondary | ICD-10-CM | POA: Diagnosis not present

## 2022-09-27 DIAGNOSIS — Z3490 Encounter for supervision of normal pregnancy, unspecified, unspecified trimester: Secondary | ICD-10-CM | POA: Diagnosis present

## 2022-09-27 DIAGNOSIS — O3663X0 Maternal care for excessive fetal growth, third trimester, not applicable or unspecified: Secondary | ICD-10-CM | POA: Diagnosis present

## 2022-09-27 LAB — URINE DRUG SCREEN, QUALITATIVE (ARMC ONLY)
Amphetamines, Ur Screen: NOT DETECTED
Barbiturates, Ur Screen: NOT DETECTED
Benzodiazepine, Ur Scrn: NOT DETECTED
Cannabinoid 50 Ng, Ur ~~LOC~~: POSITIVE — AB
Cocaine Metabolite,Ur ~~LOC~~: NOT DETECTED
MDMA (Ecstasy)Ur Screen: NOT DETECTED
Methadone Scn, Ur: NOT DETECTED
Opiate, Ur Screen: NOT DETECTED
Phencyclidine (PCP) Ur S: NOT DETECTED
Tricyclic, Ur Screen: NOT DETECTED

## 2022-09-27 LAB — CBC
HCT: 35.8 % — ABNORMAL LOW (ref 36.0–46.0)
Hemoglobin: 11.5 g/dL — ABNORMAL LOW (ref 12.0–15.0)
MCH: 26.9 pg (ref 26.0–34.0)
MCHC: 32.1 g/dL (ref 30.0–36.0)
MCV: 83.6 fL (ref 80.0–100.0)
Platelets: 222 10*3/uL (ref 150–400)
RBC: 4.28 MIL/uL (ref 3.87–5.11)
RDW: 14.5 % (ref 11.5–15.5)
WBC: 9.9 10*3/uL (ref 4.0–10.5)
nRBC: 0 % (ref 0.0–0.2)

## 2022-09-27 LAB — TYPE AND SCREEN
ABO/RH(D): O POS
Antibody Screen: NEGATIVE

## 2022-09-27 LAB — RPR: RPR Ser Ql: NONREACTIVE

## 2022-09-27 MED ORDER — LIDOCAINE HCL (PF) 1 % IJ SOLN: 30.0000 mL | INTRAMUSCULAR | Status: DC | PRN

## 2022-09-27 MED ORDER — LACTATED RINGERS IV SOLN: INTRAVENOUS | Status: DC

## 2022-09-27 MED ORDER — OXYTOCIN-SODIUM CHLORIDE 30-0.9 UT/500ML-% IV SOLN
2.5000 [IU]/h | INTRAVENOUS | Status: DC
  Administered 2022-09-28: 30 [IU] via INTRAVENOUS
  Administered 2022-09-28: 2.5 [IU]/h via INTRAVENOUS
  Filled 2022-09-27: qty 500

## 2022-09-27 MED ORDER — FENTANYL CITRATE (PF) 100 MCG/2ML IJ SOLN
50.0000 ug | INTRAMUSCULAR | Status: DC | PRN
  Administered 2022-09-27: 100 ug via INTRAVENOUS
  Filled 2022-09-27: qty 2

## 2022-09-27 MED ORDER — FENTANYL-BUPIVACAINE-NACL 0.5-0.125-0.9 MG/250ML-% EP SOLN
EPIDURAL | Status: AC
  Filled 2022-09-27: qty 250

## 2022-09-27 MED ORDER — LACTATED RINGERS IV SOLN
500.0000 mL | INTRAVENOUS | Status: DC | PRN
  Administered 2022-09-28: 500 mL via INTRAVENOUS

## 2022-09-27 MED ORDER — TERBUTALINE SULFATE 1 MG/ML IJ SOLN: 0.2500 mg | Freq: Once | INTRAMUSCULAR | Status: DC | PRN

## 2022-09-27 MED ORDER — PHENYLEPHRINE 80 MCG/ML (10ML) SYRINGE FOR IV PUSH (FOR BLOOD PRESSURE SUPPORT): 80.0000 ug | PREFILLED_SYRINGE | INTRAVENOUS | Status: DC | PRN

## 2022-09-27 MED ORDER — MISOPROSTOL 200 MCG PO TABS
ORAL_TABLET | ORAL | Status: AC
  Filled 2022-09-27: qty 4

## 2022-09-27 MED ORDER — OXYTOCIN BOLUS FROM INFUSION: 333.0000 mL | Freq: Once | INTRAVENOUS | Status: DC

## 2022-09-27 MED ORDER — LACTATED RINGERS IV SOLN
500.0000 mL | Freq: Once | INTRAVENOUS | Status: AC
  Administered 2022-09-27 – 2022-09-28 (×2): 500 mL via INTRAVENOUS

## 2022-09-27 MED ORDER — LIDOCAINE-EPINEPHRINE (PF) 1.5 %-1:200000 IJ SOLN
INTRAMUSCULAR | Status: DC | PRN
  Administered 2022-09-27: 3 mL via EPIDURAL

## 2022-09-27 MED ORDER — ONDANSETRON HCL 4 MG/2ML IJ SOLN
4.0000 mg | Freq: Four times a day (QID) | INTRAMUSCULAR | Status: DC | PRN
  Administered 2022-09-27 – 2022-09-28 (×2): 4 mg via INTRAVENOUS
  Filled 2022-09-27 (×2): qty 2

## 2022-09-27 MED ORDER — LIDOCAINE HCL (PF) 1 % IJ SOLN
INTRAMUSCULAR | Status: DC | PRN
  Administered 2022-09-27: 1 mL via SUBCUTANEOUS

## 2022-09-27 MED ORDER — OXYTOCIN 10 UNIT/ML IJ SOLN
INTRAMUSCULAR | Status: AC
  Filled 2022-09-27: qty 2

## 2022-09-27 MED ORDER — AMMONIA AROMATIC IN INHA
RESPIRATORY_TRACT | Status: AC
  Filled 2022-09-27: qty 10

## 2022-09-27 MED ORDER — HYDROXYZINE HCL 25 MG PO TABS
50.0000 mg | ORAL_TABLET | Freq: Four times a day (QID) | ORAL | Status: DC | PRN
  Filled 2022-09-27: qty 2

## 2022-09-27 MED ORDER — DIPHENHYDRAMINE HCL 50 MG/ML IJ SOLN: 12.5000 mg | INTRAMUSCULAR | Status: DC | PRN

## 2022-09-27 MED ORDER — SODIUM CHLORIDE 0.9 % IV SOLN
INTRAVENOUS | Status: DC | PRN
  Administered 2022-09-27 (×2): 5 mL via EPIDURAL

## 2022-09-27 MED ORDER — LORATADINE 10 MG PO TABS
10.0000 mg | ORAL_TABLET | Freq: Every day | ORAL | Status: DC
  Administered 2022-09-27: 10 mg via ORAL
  Filled 2022-09-27: qty 1

## 2022-09-27 MED ORDER — LIDOCAINE HCL (PF) 1 % IJ SOLN
INTRAMUSCULAR | Status: AC
  Filled 2022-09-27: qty 30

## 2022-09-27 MED ORDER — FENTANYL-BUPIVACAINE-NACL 0.5-0.125-0.9 MG/250ML-% EP SOLN
12.0000 mL/h | EPIDURAL | Status: DC | PRN
  Administered 2022-09-27: 12 mL/h via EPIDURAL

## 2022-09-27 MED ORDER — SOD CITRATE-CITRIC ACID 500-334 MG/5ML PO SOLN
30.0000 mL | ORAL | Status: DC | PRN
  Administered 2022-09-28: 30 mL via ORAL

## 2022-09-27 MED ORDER — OXYTOCIN-SODIUM CHLORIDE 30-0.9 UT/500ML-% IV SOLN
1.0000 m[IU]/min | INTRAVENOUS | Status: DC
  Administered 2022-09-27: 2 m[IU]/min via INTRAVENOUS
  Filled 2022-09-27: qty 500

## 2022-09-27 MED ORDER — EPHEDRINE 5 MG/ML INJ
10.0000 mg | INTRAVENOUS | Status: DC | PRN
  Filled 2022-09-27: qty 5

## 2022-09-27 MED ORDER — EPHEDRINE 5 MG/ML INJ
10.0000 mg | INTRAVENOUS | Status: AC | PRN
  Administered 2022-09-27 (×2): 10 mg via INTRAVENOUS

## 2022-09-27 NOTE — Progress Notes (Signed)
  Labor Progress Note   27 y.o. [id number] @ [redacted]w[redacted]d , admitted for  Pregnancy, Labor Management. IOL/TOLAC  Subjective:  Feels contractions mild to moderate  Objective:  BP 119/70   Pulse 93   Temp 97.9 F (36.6 C)   Resp 16   Ht 5\' 7"  (1.702 m)   Wt 124.3 kg   LMP 12/11/2021 (Exact Date)   BMI 42.92 kg/m  Abd: gravid, ND, FHT present, mild tenderness on exam Extr: trace to 1+ bilateral pedal edema SVE: CERVIX: 6 cm dilated, 70 effaced, -2 station  EFM: FHR: 140 bpm, variability: moderate,  accelerations:  Present,  decelerations:  Absent Toco: Frequency: Every 2-4 minutes Labs: I have reviewed the patient's lab results.   Assessment & Plan:  [id number] @ [redacted]w[redacted]d, admitted for  Pregnancy and Labor/Delivery Management  1. Pain management:  position changes . 2. FWB: FHT category I.  3. ID: GBS negative 4. Labor management: continue pitocin titration, consider AROM with next check  All discussed with patient, see orders   Tresea Mall, CNM [facility] 09/27/2022  7:41 PM

## 2022-09-27 NOTE — Progress Notes (Signed)
  Labor Progress Note   27 y.o. V4Q5956 @ 110w0d , admitted for  Pregnancy, Labor Management. IOL/TOLAC  Subjective:  Contractions are getting stronger  Objective:  BP (!) 98/53   Pulse 88   Temp 97.9 F (36.6 C) (Oral)   Resp 18   Ht 5\' 7"  (1.702 m)   Wt 124.3 kg   LMP 12/11/2021 (Exact Date)   BMI 42.92 kg/m  Abd: gravid, ND, FHT present, mild tenderness on exam Extr: trace to 1+ bilateral pedal edema SVE: CERVIX: 3 cm dilated, 60 effaced, -3, -2 station Posterior cervix, swept, Cook Catheter placed  EFM: FHR: 145 bpm, variability: moderate,  accelerations:  Present,  decelerations:  Absent Toco: Frequency: Every 2-5 minutes Labs: I have reviewed the patient's lab results.   Assessment & Plan:  [id number] @ [redacted]w[redacted]d, admitted for  Pregnancy and Labor/Delivery Management  1. Pain management:  position changes . 2. FWB: FHT category I.  3. ID: GBS negative 4. Labor management: s/p [facility] Catheter, continue pitocin titration  All discussed with patient, see orders   Tresea Mall, CNM [place] [facility] 09/27/2022  11:45 AM

## 2022-09-27 NOTE — Anesthesia Procedure Notes (Signed)
Epidural Patient location during procedure: OB Start time: 09/27/2022 9:53 PM End time: 09/27/2022 9:58 PM  Staffing Anesthesiologist: Nioma Mccubbins, Cleda Mccreedy, MD Performed: anesthesiologist   Preanesthetic Checklist Completed: patient identified, IV checked, site marked, risks and benefits discussed, surgical consent, monitors and equipment checked, pre-op evaluation and timeout performed  Epidural Patient position: sitting Prep: ChloraPrep Patient monitoring: heart rate, continuous pulse ox and blood pressure Approach: midline Location: L3-L4 Injection technique: LOR saline  Needle:  Needle type: Tuohy  Needle gauge: 17 G Needle length: 9 cm and 9 Needle insertion depth: 8 cm Catheter type: closed end flexible Catheter size: 19 Gauge Catheter at skin depth: 14 cm Test dose: negative and 1.5% lidocaine with Epi 1:200 K  Assessment Sensory level: T10 Events: blood not aspirated, no cerebrospinal fluid, injection not painful, no injection resistance, no paresthesia and negative IV test  Additional Notes 1 attempt Pt. Evaluated and documentation done after procedure finished. Patient identified. Risks/Benefits/Options discussed with patient including but not limited to bleeding, infection, nerve damage, paralysis, failed block, incomplete pain control, headache, blood pressure changes, nausea, vomiting, reactions to medication both or allergic, itching and postpartum back pain. Confirmed with bedside nurse the patient's most recent platelet count. Confirmed with patient that they are not currently taking any anticoagulation, have any bleeding history or any family history of bleeding disorders. Patient expressed understanding and wished to proceed. All questions were answered. Sterile technique was used throughout the entire procedure. Please see nursing notes for vital signs. Test dose was given through epidural catheter and negative prior to continuing to dose epidural or start  infusion. Warning signs of high block given to the patient including shortness of breath, tingling/numbness in hands, complete motor block, or any concerning symptoms with instructions to call for help. Patient was given instructions on fall risk and not to get out of bed. All questions and concerns addressed with instructions to call with any issues or inadequate analgesia.    Patient tolerated the insertion well without immediate complications.Reason for block:procedure for pain
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
## 2022-09-27 NOTE — Anesthesia Preprocedure Evaluation (Signed)
Anesthesia Evaluation  Patient identified by MRN, date of birth, ID band Patient awake    Reviewed: Allergy & Precautions, NPO status , Patient's Chart, lab work & pertinent test results  History of Anesthesia Complications Negative for: history of anesthetic complications  Airway Mallampati: III  TM Distance: >3 FB Neck ROM: full    Dental  (+) Chipped   Pulmonary former smoker   Pulmonary exam normal        Cardiovascular Exercise Tolerance: Good (-) hypertensionnegative cardio ROS Normal cardiovascular exam     Neuro/Psych    GI/Hepatic negative GI ROS,,,  Endo/Other    Morbid obesity  Renal/GU Renal disease  negative genitourinary   Musculoskeletal   Abdominal   Peds  Hematology negative hematology ROS (+)   Anesthesia Other Findings Patent pushing for vaginal birth during TOLAC with no success. Planning on c section. Discussed with her how the epidural was working and she stated it was not working well. Turned off the epidural and pulled it. Will plan on spinal anesthesia for c section. Explained risks to patient. Patient stated she understood and agreed to plan.   Past Medical History: No date: Bartholin cyst 2013: Chlamydia No date: Ectopic pregnancy No date: Iron deficiency anemia No date: Menometrorrhagia No date: Missed abortion No date: Normal cardiac stress test No date: Renal disorder No date: Sepsis (HCC) No date: Vaginal discharge  Past Surgical History: No date: CESAREAN SECTION No date: DIAGNOSTIC LAPAROSCOPY No date: DILATION AND CURETTAGE, DIAGNOSTIC / THERAPEUTIC No date: ECTOPIC PREGNANCY SURGERY No date: WISDOM TOOTH EXTRACTION     Comment:  all four; ~age 50/19  BMI    Body Mass Index: 42.92 kg/m      Reproductive/Obstetrics (+) Pregnancy                             Anesthesia Physical Anesthesia Plan  ASA: 3  Anesthesia Plan: Epidural and  General/Spinal   Post-op Pain Management:    Induction:   PONV Risk Score and Plan:   Airway Management Planned: Natural Airway  Additional Equipment:   Intra-op Plan:   Post-operative Plan:   Informed Consent: I have reviewed the patients History and Physical, chart, labs and discussed the procedure including the risks, benefits and alternatives for the proposed anesthesia with the patient or authorized representative who has indicated his/her understanding and acceptance.     Dental Advisory Given  Plan Discussed with: Anesthesiologist  Anesthesia Plan Comments: (Patient reports no bleeding problems and no anticoagulant use.   Patient consented for risks of anesthesia including but not limited to:  - adverse reactions to medications - risk of bleeding, infection and or nerve damage from epidural that could lead to paralysis - risk of headache or failed epidural - nerve damage due to positioning - that if epidural is used for C-section that there is a chance of epidural failure requiring spinal placement or conversion to GA - Damage to heart, brain, lungs, other parts of body or loss of life  Patient voiced understanding.)       Anesthesia Quick Evaluation

## 2022-09-28 ENCOUNTER — Other Ambulatory Visit: Payer: Self-pay

## 2022-09-28 ENCOUNTER — Encounter
Admission: EM | Disposition: A | Discharge status: Home / Self Care | Payer: Self-pay | Source: Home / Self Care | Attending: Certified Nurse Midwife

## 2022-09-28 ENCOUNTER — Encounter: Payer: Self-pay | Admitting: Obstetrics and Gynecology

## 2022-09-28 DIAGNOSIS — A749 Chlamydial infection, unspecified: Secondary | ICD-10-CM

## 2022-09-28 DIAGNOSIS — O480 Post-term pregnancy: Secondary | ICD-10-CM

## 2022-09-28 DIAGNOSIS — O98313 Other infections with a predominantly sexual mode of transmission complicating pregnancy, third trimester: Secondary | ICD-10-CM

## 2022-09-28 DIAGNOSIS — O34211 Maternal care for low transverse scar from previous cesarean delivery: Secondary | ICD-10-CM

## 2022-09-28 DIAGNOSIS — Z3A40 40 weeks gestation of pregnancy: Secondary | ICD-10-CM

## 2022-09-28 DIAGNOSIS — O358XX0 Maternal care for other (suspected) fetal abnormality and damage, not applicable or unspecified: Secondary | ICD-10-CM

## 2022-09-28 LAB — CBC
HCT: 32.8 % — ABNORMAL LOW (ref 36.0–46.0)
Hemoglobin: 10.4 g/dL — ABNORMAL LOW (ref 12.0–15.0)
MCH: 26.7 pg (ref 26.0–34.0)
MCHC: 31.7 g/dL (ref 30.0–36.0)
MCV: 84.3 fL (ref 80.0–100.0)
Platelets: 187 10*3/uL (ref 150–400)
RBC: 3.89 MIL/uL (ref 3.87–5.11)
RDW: 14.6 % (ref 11.5–15.5)
WBC: 11.7 10*3/uL — ABNORMAL HIGH (ref 4.0–10.5)
nRBC: 0 % (ref 0.0–0.2)

## 2022-09-28 LAB — CREATININE, SERUM
Creatinine, Ser: 0.72 mg/dL (ref 0.44–1.00)
GFR, Estimated: >60 mL/min (ref >60)

## 2022-09-28 SURGERY — Surgical Case
Anesthesia: Spinal

## 2022-09-28 MED ORDER — ONDANSETRON HCL 4 MG/2ML IJ SOLN
INTRAMUSCULAR | Status: DC | PRN
  Administered 2022-09-28: 4 mg via INTRAVENOUS

## 2022-09-28 MED ORDER — FLEET ENEMA 7-19 GM/118ML RE ENEM: 1.0000 | ENEMA | Freq: Every day | RECTAL | Status: DC | PRN

## 2022-09-28 MED ORDER — TRANEXAMIC ACID-NACL 1000-0.7 MG/100ML-% IV SOLN
INTRAVENOUS | Status: AC
  Filled 2022-09-28: qty 100

## 2022-09-28 MED ORDER — IBUPROFEN 600 MG PO TABS
600.0000 mg | ORAL_TABLET | Freq: Four times a day (QID) | ORAL | Status: DC
  Administered 2022-09-29 – 2022-09-30 (×5): 600 mg via ORAL
  Filled 2022-09-28 (×5): qty 1

## 2022-09-28 MED ORDER — SUCCINYLCHOLINE CHLORIDE 200 MG/10ML IV SOSY
PREFILLED_SYRINGE | INTRAVENOUS | Status: AC
  Filled 2022-09-28: qty 10

## 2022-09-28 MED ORDER — DIPHENHYDRAMINE HCL 50 MG/ML IJ SOLN: 12.5000 mg | INTRAMUSCULAR | Status: DC | PRN

## 2022-09-28 MED ORDER — FERROUS SULFATE 325 (65 FE) MG PO TABS
325.0000 mg | ORAL_TABLET | Freq: Two times a day (BID) | ORAL | Status: DC
  Administered 2022-09-28 – 2022-09-30 (×4): 325 mg via ORAL
  Filled 2022-09-28 (×4): qty 1

## 2022-09-28 MED ORDER — MEASLES, MUMPS & RUBELLA VAC IJ SOLR
0.5000 mL | Freq: Once | INTRAMUSCULAR | Status: DC
  Filled 2022-09-28: qty 0.5

## 2022-09-28 MED ORDER — DIBUCAINE (PERIANAL) 1 % EX OINT: 1.0000 | TOPICAL_OINTMENT | CUTANEOUS | Status: DC | PRN

## 2022-09-28 MED ORDER — DIPHENHYDRAMINE HCL 25 MG PO CAPS: 25.0000 mg | ORAL_CAPSULE | Freq: Four times a day (QID) | ORAL | Status: DC | PRN

## 2022-09-28 MED ORDER — ACETAMINOPHEN 500 MG PO TABS: 1000.0000 mg | ORAL_TABLET | Freq: Four times a day (QID) | ORAL | Status: DC | PRN

## 2022-09-28 MED ORDER — OXYCODONE HCL 5 MG PO TABS: 5.0000 mg | ORAL_TABLET | Freq: Four times a day (QID) | ORAL | Status: DC | PRN

## 2022-09-28 MED ORDER — ONDANSETRON HCL 4 MG/2ML IJ SOLN: 4.0000 mg | Freq: Three times a day (TID) | INTRAMUSCULAR | Status: DC | PRN

## 2022-09-28 MED ORDER — NALOXONE HCL 0.4 MG/ML IJ SOLN: 0.4000 mg | INTRAMUSCULAR | Status: DC | PRN

## 2022-09-28 MED ORDER — MENTHOL 3 MG MT LOZG: 1.0000 | LOZENGE | OROMUCOSAL | Status: DC | PRN

## 2022-09-28 MED ORDER — LORATADINE 10 MG PO TABS
10.0000 mg | ORAL_TABLET | Freq: Every day | ORAL | Status: DC
  Administered 2022-09-28 – 2022-09-29 (×2): 10 mg via ORAL
  Filled 2022-09-28 (×5): qty 1

## 2022-09-28 MED ORDER — COCONUT OIL OIL
1.0000 | TOPICAL_OIL | Status: DC | PRN
  Filled 2022-09-28: qty 7.5

## 2022-09-28 MED ORDER — NALOXONE HCL 4 MG/10ML IJ SOLN: 1.0000 ug/kg/h | INTRAVENOUS | Status: DC | PRN

## 2022-09-28 MED ORDER — KETOROLAC TROMETHAMINE 30 MG/ML IJ SOLN
30.0000 mg | Freq: Four times a day (QID) | INTRAMUSCULAR | Status: AC
  Administered 2022-09-28 – 2022-09-29 (×3): 30 mg via INTRAVENOUS
  Filled 2022-09-28 (×3): qty 1

## 2022-09-28 MED ORDER — WITCH HAZEL-GLYCERIN EX PADS: 1.0000 | MEDICATED_PAD | CUTANEOUS | Status: DC | PRN

## 2022-09-28 MED ORDER — ACETAMINOPHEN 500 MG PO TABS
1000.0000 mg | ORAL_TABLET | Freq: Four times a day (QID) | ORAL | Status: DC
  Administered 2022-09-28 – 2022-09-30 (×8): 1000 mg via ORAL
  Filled 2022-09-28 (×10): qty 2

## 2022-09-28 MED ORDER — SIMETHICONE 80 MG PO CHEW: 80.0000 mg | CHEWABLE_TABLET | ORAL | Status: DC | PRN

## 2022-09-28 MED ORDER — KETOROLAC TROMETHAMINE 30 MG/ML IJ SOLN: 30.0000 mg | Freq: Four times a day (QID) | INTRAMUSCULAR | Status: AC | PRN

## 2022-09-28 MED ORDER — BUPIVACAINE HCL (PF) 0.5 % IJ SOLN
INTRAMUSCULAR | Status: AC
  Filled 2022-09-28: qty 30

## 2022-09-28 MED ORDER — DEXMEDETOMIDINE HCL IN NACL 80 MCG/20ML IV SOLN
INTRAVENOUS | Status: DC | PRN
  Administered 2022-09-28: 4 ug via INTRAVENOUS

## 2022-09-28 MED ORDER — GABAPENTIN 300 MG PO CAPS
300.0000 mg | ORAL_CAPSULE | Freq: Every day | ORAL | Status: DC
  Administered 2022-09-28 – 2022-09-29 (×2): 300 mg via ORAL
  Filled 2022-09-28 (×2): qty 1

## 2022-09-28 MED ORDER — CHLORHEXIDINE GLUCONATE 0.12 % MT SOLN
OROMUCOSAL | Status: AC
  Administered 2022-09-28: 15 mL
  Filled 2022-09-28: qty 15

## 2022-09-28 MED ORDER — SCOPOLAMINE 1 MG/3DAYS TD PT72: 1.0000 | MEDICATED_PATCH | Freq: Once | TRANSDERMAL | Status: DC

## 2022-09-28 MED ORDER — SIMETHICONE 80 MG PO CHEW
80.0000 mg | CHEWABLE_TABLET | Freq: Three times a day (TID) | ORAL | Status: DC
  Administered 2022-09-28 – 2022-09-30 (×7): 80 mg via ORAL
  Filled 2022-09-28 (×7): qty 1

## 2022-09-28 MED ORDER — DIPHENHYDRAMINE HCL 25 MG PO CAPS: 25.0000 mg | ORAL_CAPSULE | ORAL | Status: DC | PRN

## 2022-09-28 MED ORDER — TETANUS-DIPHTH-ACELL PERTUSSIS 5-2.5-18.5 LF-MCG/0.5 IM SUSY: 0.5000 mL | PREFILLED_SYRINGE | Freq: Once | INTRAMUSCULAR | Status: DC

## 2022-09-28 MED ORDER — BUPIVACAINE IN DEXTROSE 0.75-8.25 % IT SOLN
INTRATHECAL | Status: DC | PRN
  Administered 2022-09-28: 1.4 mL via INTRATHECAL

## 2022-09-28 MED ORDER — FENTANYL CITRATE (PF) 100 MCG/2ML IJ SOLN
INTRAMUSCULAR | Status: AC
  Filled 2022-09-28: qty 2

## 2022-09-28 MED ORDER — ENOXAPARIN SODIUM 40 MG/0.4ML IJ SOSY
40.0000 mg | PREFILLED_SYRINGE | INTRAMUSCULAR | Status: DC
  Administered 2022-09-29 – 2022-09-30 (×2): 40 mg via SUBCUTANEOUS
  Filled 2022-09-28 (×3): qty 0.4

## 2022-09-28 MED ORDER — LACTATED RINGERS IV SOLN: INTRAVENOUS | Status: DC

## 2022-09-28 MED ORDER — FLUTICASONE PROPIONATE 50 MCG/ACT NA SUSP
2.0000 | Freq: Every day | NASAL | Status: DC
  Filled 2022-09-28: qty 16

## 2022-09-28 MED ORDER — BUPIVACAINE HCL (PF) 0.5 % IJ SOLN: 30.0000 mL | Freq: Once | INTRAMUSCULAR | Status: DC

## 2022-09-28 MED ORDER — PROPOFOL 10 MG/ML IV BOLUS
INTRAVENOUS | Status: AC
  Filled 2022-09-28: qty 20

## 2022-09-28 MED ORDER — PHENYLEPHRINE HCL (PRESSORS) 10 MG/ML IV SOLN
INTRAVENOUS | Status: DC | PRN
  Administered 2022-09-28 (×2): 80 ug via INTRAVENOUS

## 2022-09-28 MED ORDER — SENNOSIDES-DOCUSATE SODIUM 8.6-50 MG PO TABS
2.0000 | ORAL_TABLET | ORAL | Status: DC
  Administered 2022-09-29 – 2022-09-30 (×2): 2 via ORAL
  Filled 2022-09-28 (×2): qty 2

## 2022-09-28 MED ORDER — OXYTOCIN-SODIUM CHLORIDE 30-0.9 UT/500ML-% IV SOLN: 2.5000 [IU]/h | INTRAVENOUS | Status: AC

## 2022-09-28 MED ORDER — BUPIVACAINE HCL (PF) 0.5 % IJ SOLN
INTRAMUSCULAR | Status: DC | PRN
  Administered 2022-09-28: 30 mL

## 2022-09-28 MED ORDER — PRENATAL MULTIVITAMIN CH
1.0000 | ORAL_TABLET | Freq: Every day | ORAL | Status: DC
  Administered 2022-09-29 – 2022-09-30 (×2): 1 via ORAL
  Filled 2022-09-28 (×2): qty 1

## 2022-09-28 MED ORDER — SODIUM CHLORIDE 0.9% FLUSH: 3.0000 mL | INTRAVENOUS | Status: DC | PRN

## 2022-09-28 MED ORDER — CEFAZOLIN SODIUM-DEXTROSE 2-4 GM/100ML-% IV SOLN: 2.0000 g | INTRAVENOUS | Status: DC

## 2022-09-28 MED ORDER — METHYLERGONOVINE MALEATE 0.2 MG/ML IJ SOLN
INTRAMUSCULAR | Status: AC
  Filled 2022-09-28: qty 1

## 2022-09-28 MED ORDER — OXYCODONE HCL 5 MG PO TABS
5.0000 mg | ORAL_TABLET | ORAL | Status: DC | PRN
  Administered 2022-09-28 – 2022-09-30 (×3): 5 mg via ORAL
  Filled 2022-09-28 (×3): qty 1

## 2022-09-28 MED ORDER — SOD CITRATE-CITRIC ACID 500-334 MG/5ML PO SOLN: 30.0000 mL | ORAL | Status: DC

## 2022-09-28 MED ORDER — MEPERIDINE HCL 25 MG/ML IJ SOLN: 6.2500 mg | INTRAMUSCULAR | Status: DC | PRN

## 2022-09-28 MED ORDER — PHENYLEPHRINE 80 MCG/ML (10ML) SYRINGE FOR IV PUSH (FOR BLOOD PRESSURE SUPPORT)
PREFILLED_SYRINGE | INTRAVENOUS | Status: AC
  Filled 2022-09-28: qty 10

## 2022-09-28 MED ORDER — FENTANYL CITRATE (PF) 100 MCG/2ML IJ SOLN
INTRAMUSCULAR | Status: DC | PRN
  Administered 2022-09-28: 15 ug via INTRATHECAL

## 2022-09-28 MED ORDER — PHENYLEPHRINE HCL-NACL 20-0.9 MG/250ML-% IV SOLN
INTRAVENOUS | Status: AC
  Filled 2022-09-28: qty 250

## 2022-09-28 MED ORDER — KETOROLAC TROMETHAMINE 30 MG/ML IJ SOLN
INTRAMUSCULAR | Status: DC | PRN
  Administered 2022-09-28: 30 mg via INTRAVENOUS

## 2022-09-28 MED ORDER — CEFAZOLIN IN SODIUM CHLORIDE 3-0.9 GM/100ML-% IV SOLN
3.0000 g | Freq: Once | INTRAVENOUS | Status: AC
  Administered 2022-09-28: 3 g via INTRAVENOUS
  Filled 2022-09-28: qty 100

## 2022-09-28 MED ORDER — SOD CITRATE-CITRIC ACID 500-334 MG/5ML PO SOLN
ORAL | Status: AC
  Filled 2022-09-28: qty 15

## 2022-09-28 MED ORDER — SODIUM CHLORIDE 0.9 % IV SOLN
INTRAVENOUS | Status: AC
  Filled 2022-09-28: qty 5

## 2022-09-28 MED ORDER — DEXTROSE 5 % IV SOLN
1000.0000 mg | INTRAVENOUS | Status: DC
  Filled 2022-09-28: qty 10

## 2022-09-28 MED ORDER — ACETAMINOPHEN 500 MG PO TABS
ORAL_TABLET | ORAL | Status: AC
  Administered 2022-09-28: 1000 mg via ORAL
  Filled 2022-09-28: qty 2

## 2022-09-28 MED ORDER — ACETAMINOPHEN 500 MG PO TABS
1000.0000 mg | ORAL_TABLET | Freq: Four times a day (QID) | ORAL | Status: DC
Start: 2022-09-28 — End: 2022-09-28

## 2022-09-28 MED ORDER — BISACODYL 10 MG RE SUPP: 10.0000 mg | Freq: Every day | RECTAL | Status: DC | PRN

## 2022-09-28 MED ORDER — PHENYLEPHRINE HCL-NACL 20-0.9 MG/250ML-% IV SOLN
INTRAVENOUS | Status: DC | PRN
  Administered 2022-09-28: 50 ug/min via INTRAVENOUS

## 2022-09-28 MED ORDER — SODIUM CHLORIDE 0.9 % IV SOLN: 500.0000 mg | Freq: Once | INTRAVENOUS | Status: DC

## 2022-09-28 MED ORDER — CALCIUM CARBONATE ANTACID 500 MG PO CHEW
2.0000 | CHEWABLE_TABLET | Freq: Once | ORAL | Status: DC
  Filled 2022-09-28: qty 2

## 2022-09-28 SURGICAL SUPPLY — 32 items
CHLORAPREP W/TINT 26 (MISCELLANEOUS) ×1 IMPLANT
DRSG TELFA 3X8 NADH STRL (GAUZE/BANDAGES/DRESSINGS) ×1 IMPLANT
ELECT REM PT RETURN 9FT ADLT (ELECTROSURGICAL) ×1
ELECTRODE REM PT RTRN 9FT ADLT (ELECTROSURGICAL) ×1 IMPLANT
GAUZE SPONGE 4X4 12PLY STRL (GAUZE/BANDAGES/DRESSINGS) ×1 IMPLANT
GOWN STRL REUS W/ TWL LRG LVL3 (GOWN DISPOSABLE) ×3 IMPLANT
GOWN STRL REUS W/TWL LRG LVL3 (GOWN DISPOSABLE) ×3
MANIFOLD NEPTUNE II (INSTRUMENTS) ×1 IMPLANT
MAT PREVALON FULL STRYKER (MISCELLANEOUS) ×1 IMPLANT
NDL HYPO 25GX1X1/2 BEV (NEEDLE) ×1 IMPLANT
NEEDLE HYPO 25GX1X1/2 BEV (NEEDLE) ×1 IMPLANT
NS IRRIG 1000ML POUR BTL (IV SOLUTION) ×1 IMPLANT
PACK C SECTION AR (MISCELLANEOUS) ×1 IMPLANT
PAD OB MATERNITY 4.3X12.25 (PERSONAL CARE ITEMS) ×1 IMPLANT
PAD PREP OB/GYN DISP 24X41 (PERSONAL CARE ITEMS) ×1 IMPLANT
RTRCTR C-SECT PINK 25CM LRG (MISCELLANEOUS) IMPLANT
SCRUB CHG 4% DYNA-HEX 4OZ (MISCELLANEOUS) ×1 IMPLANT
STAPLER INSORB 30 2030 C-SECTI (MISCELLANEOUS) IMPLANT
SUT MAXON ABS #0 GS21 30IN (SUTURE) IMPLANT
SUT MNCRL 4-0 (SUTURE) ×1
SUT MNCRL 4-0 27XMFL (SUTURE) ×1
SUT VIC AB 0 CT1 36 (SUTURE) ×2 IMPLANT
SUT VIC AB 0 CTX 36 (SUTURE) ×2
SUT VIC AB 0 CTX36XBRD ANBCTRL (SUTURE) ×2 IMPLANT
SUT VIC AB 2-0 SH 27 (SUTURE) ×2
SUT VIC AB 2-0 SH 27XBRD (SUTURE) ×2 IMPLANT
SUT VIC AB 3-0 SH 27 (SUTURE) ×1
SUT VIC AB 3-0 SH 27X BRD (SUTURE) IMPLANT
SUTURE MNCRL 4-0 27XMF (SUTURE) ×1 IMPLANT
SYR 30ML LL (SYRINGE) ×2 IMPLANT
TRAP FLUID SMOKE EVACUATOR (MISCELLANEOUS) ×1 IMPLANT
WATER STERILE IRR 500ML POUR (IV SOLUTION) ×1 IMPLANT

## 2022-09-28 NOTE — Progress Notes (Signed)
Fetal tachycardia noted along with maternal fever of 99.7 axillary. Tylenol and fluid bolus given. Attempted to push past rim of cervix with fair maternal effort. She is having a lot of hip pain, which is affecting her ability to push. Left the room to give report to incoming CNM Pattricia Boss.When we returned to the room the patient said she is unable to continue pushing- "I can't do this anymore". She is requesting a c/section. Pattricia Boss will notify Dr Dalbert Garnet.   BP 124/73 (BP Location: Left Arm)   Pulse (!) 161   Temp 99.7 F (37.6 C) (Axillary)   Resp 18   Ht 5\' 7"  (1.702 m)   Wt 124.3 kg   LMP 12/11/2021 (Exact Date)   SpO2 98%   BMI 42.92 kg/m   Cervix: 9.5/some swelling/0  Toco: q 2-3 minutes Fetal well being: was 200/212 and now 180s/slowly resolving to normal range, moderate variability, +accelerations, -decelerations  Pitocin turned off and preparing for c/section.   Tresea Mall, CNM

## 2022-09-28 NOTE — Lactation Note (Signed)
This note was copied from a baby's chart. Lactation Consultation Note  Patient Name: Andrea Chang [id number] Date: 09/28/2022 Age:27 hours Reason for consult: Initial assessment;Term;1st time breastfeeding;Breastfeeding assistance   Maternal Data This is mom's 2nd baby, repeat C/S-arrest of dilation. Per chart review mom with a history of marijuana use (positive screen in April).   On initial visit in L&D today assisted mom with breastfeeding. Mom reports her plan is to breastfeed in the home and in public she plans on bottle feeding. Provided mom with information on how to maximize establishing her milk supply by focusing on breastfeeding the first two weeks until her supply is established. Discussed options to pump and provide her milk when she goes out in public. Mom requested formula and a pacifier be placed in the baby's crib drawer so she has the option if she would like to offer the baby the pacifier or formula supplement.  Has patient been taught Hand Expression?: Yes Does the patient have breastfeeding experience prior to this delivery?: No  Feeding Mother's Current Feeding Choice: Breast Milk and Formula Provided tips and strategies to assist mom with maximizing position and latch techniques.  LATCH Score Latch: Grasps breast easily, tongue down, lips flanged, rhythmical sucking.  Audible Swallowing: Spontaneous and intermittent  Type of Nipple: Everted at rest and after stimulation  Comfort (Breast/Nipple): Soft / non-tender  Hold (Positioning): Assistance needed to correctly position infant at breast and maintain latch.  LATCH Score: 9   Interventions Interventions: Breast feeding basics reviewed;Assisted with latch;Breast massage;Hand express;Breast compression;Adjust position;Support pillows;Position options;Education  Discharge Pump: Personal  Consult Status Consult Status: Follow-up from L&D  Update provided to care nurse.  Andrea Chang 09/28/2022,  3:09 PM

## 2022-09-28 NOTE — Progress Notes (Signed)
LABOR NOTE   Andrea Chang 27 y.o.GP@ at [redacted]w[redacted]d  SUBJECTIVE:  Pt is pushing, crying out in discomfort. States that she can not do this any more. She is requesting a repeat c section. She declines to push any longer.  Analgesia: Epidural  OBJECTIVE:  BP 124/73 (BP Location: Left Arm)   Pulse (!) 161   Temp 99.7 F (37.6 C) (Axillary)   Resp 18   Ht 5\' 7"  (1.702 m)   Wt 124.3 kg   LMP 12/11/2021 (Exact Date)   SpO2 98%   BMI 42.92 kg/m  Total I/O In: -  Out: 1000 [Urine:1000]  She has not shown cervical change. CERVIX: pre Erskine Squibb she has rim that she was trying to reduce. Pt declines to let me examine her.  SVE:   Dilation: Lip/rim Effacement (%): 90 Station: 0 Exam by:: Julianne Handler, RN CONTRACTIONS: regular, every 2-3 minutes FHR: Fetal heart tracing reviewed. Baseline: 175 bpm, Variability: Fair (1-6 bpm), Accelerations: Reactive, and Decelerations: Absent Category II    Labs: Lab Results  Component Value Date   WBC 9.9 09/27/2022   HGB 11.5 (L) 09/27/2022   HCT 35.8 (L) 09/27/2022   MCV 83.6 09/27/2022   PLT 222 09/27/2022    ASSESSMENT: 1) Labor curve reviewed.       Progress: prolonged      Membranes: ruptured            Principal Problem:   Encounter for induction of labor TOLAC failed   PLAN: Dr. Dalbert Garnet consulted. In agreement to repeat cesarean section. Anesthesia notified.   Doreene Burke, CNM  09/28/2022 8:19 AM

## 2022-09-28 NOTE — Anesthesia Procedure Notes (Signed)
Spinal  Patient location during procedure: OR Start time: 09/28/2022 9:30 AM End time: 09/28/2022 9:34 AM Reason for block: surgical anesthesia Staffing Performed: resident/CRNA  Anesthesiologist: Stephanie Coup, MD Resident/CRNA: Karoline Caldwell, CRNA Performed by: Karoline Caldwell, CRNA Authorized by: Stephanie Coup, MD   Preanesthetic Checklist Completed: patient identified, IV checked, site marked, risks and benefits discussed, surgical consent, monitors and equipment checked, pre-op evaluation and timeout performed Spinal Block Patient position: sitting Prep: ChloraPrep Patient monitoring: heart rate, continuous pulse ox, blood pressure and cardiac monitor Approach: midline Location: L3-4 Injection technique: single-shot Needle Needle type: Whitacre and Introducer  Needle gauge: 24 G Needle length: 9 cm Assessment Sensory level: T4 Events: CSF return Additional Notes Sterile aseptic technique used throughout the procedure.  Negative paresthesia. Negative blood return. Positive free-flowing CSF. Expiration date of kit checked and confirmed. Patient tolerated procedure well, without complications.

## 2022-09-28 NOTE — Progress Notes (Signed)
Pt moving position at this time.

## 2022-09-28 NOTE — Op Note (Addendum)
  Cesarean Section Procedure Note  Date of procedure: 09/28/2022   Pre-operative Diagnosis: Intrauterine pregnancy at [redacted]w[redacted]d;  - iol for BMI >40 - prior cesarean section - bilateral fetal hydronephrosis - failure of descent  Post-operative Diagnosis: same, delivered +  - failed trial of labor after cesarean  Procedure: Repeat Low Transverse Cesarean Section through Pfannenstiel incision  An experienced assistant was required given the standard of surgical care given the complexity of the case.  This assistant was needed for exposure, dissection, suctioning, retraction, instrument exchange,  CNM assisting with delivery with administration of fundal pressure, and for overall help during the procedure.   Surgeon: Christeen Douglas, MD  Assistant(s):  Doreene Burke, CNM   Anesthesia: Epidural anesthesia and Spinal anesthesia  Anesthesiologist: Stephanie Coup, MD Anesthesiologist: Piscitello, Cleda Mccreedy, MD; Stephanie Coup, MD CRNA: Karoline Caldwell, CRNA  Estimated Blood Loss:           Drains: foley         Total IV Fluids:  Urine Output:         Specimens: none         Complications:  None; patient tolerated the procedure well.         Disposition: PACU - hemodynamically stable.         Condition: stable  Findings:  A female infant in OP presentation. Amniotic fluid - Clear  Birth weight 3970 g.  Apgars of 9 and 9 at one and five minutes respectively.  Intact placenta with a three-vessel cord.  Grossly normal uterus, tubes and ovaries bilaterally. Moderate intraabdominal adhesions were noted.  Procedure Details  The patient was taken to Operating Room, identified as the correct patient and the procedure verified as C-Section Delivery. A formal Time Out was held with all team members present and in agreement.  After induction of anesthesia, the patient was draped and prepped in the usual sterile manner. A Pfannenstiel skin incision was made and carried down  through the subcutaneous tissue to the fascia. Fascial incision was made and extended transversely with the Mayo scissors. The fascia was separated from the underlying rectus tissue superiorly and inferiorly. The peritoneum was identified and entered bluntly. Peritoneal incision was extended longitudinally. The utero-vesical peritoneal reflection was incised transversely and a bladder flap was created digitally.   A low transverse hysterotomy was made. The fetus was delivered atraumatically. The umbilical cord was clamped x2 and cut and the infant was handed to the awaiting pediatricians. The placenta was removed intact and appeared normal, intact, and with a 3-vessel cord.   The uterus was exteriorized and cleared of all clot and debris. The hysterotomy was closed with running sutures of 0-Vicryl. A second imbricating layer was placed with the same suture. Excellent hemostasis was observed. The peritoneal cavity was cleared of all clots and debris. The uterus was returned to the abdomen.   Gutters and pelvis were evaluated and excellent hemostasis was noted. The fascia was then reapproximated with running sutures of 0 Maxon.    The skin was reapproximated with Ensorb absorbable staples. 30 of 0.5% bupivicaine in 50ml of NSS was placed in the fascial and skin lines.  Instrument, sponge, and needle counts were correct prior to the abdominal closure and at the conclusion of the case.   The patient tolerated the procedure well and was transferred to the recovery room in stable condition.   Christeen Douglas, MD 09/28/2022

## 2022-09-28 NOTE — H&P (View-Only) (Signed)
LABOR NOTE   Andrea Chang 27 y.o.GP@ at [redacted]w[redacted]d  SUBJECTIVE:  Pt is pushing, crying out in discomfort. States that she can not do this any more. She is requesting a repeat c section. She declines to push any longer.  Analgesia: Epidural  OBJECTIVE:  BP 124/73 (BP Location: Left Arm)   Pulse (!) 161   Temp 99.7 F (37.6 C) (Axillary)   Resp 18   Ht 5' 7" (1.702 m)   Wt 124.3 kg   LMP 12/11/2021 (Exact Date)   SpO2 98%   BMI 42.92 kg/m  Total I/O In: -  Out: 1000 [Urine:1000]  She has not shown cervical change. CERVIX: pre Andrea Chang she has rim that she was trying to reduce. Pt declines to let me examine her.  SVE:   Dilation: Lip/rim Effacement (%): 90 Station: 0 Exam by:: R Lynn, RN CONTRACTIONS: regular, every 2-3 minutes FHR: Fetal heart tracing reviewed. Baseline: 175 bpm, Variability: Fair (1-6 bpm), Accelerations: Reactive, and Decelerations: Absent Category II    Labs: Lab Results  Component Value Date   WBC 9.9 09/27/2022   HGB 11.5 (L) 09/27/2022   HCT 35.8 (L) 09/27/2022   MCV 83.6 09/27/2022   PLT 222 09/27/2022    ASSESSMENT: 1) Labor curve reviewed.       Progress: prolonged      Membranes: ruptured            Principal Problem:   Encounter for induction of labor TOLAC failed   PLAN: Dr. Beasley consulted. In agreement to repeat cesarean section. Anesthesia notified.   Andrea Chang Andrea Chang, CNM  09/28/2022 8:19 AM   

## 2022-09-28 NOTE — Progress Notes (Signed)
  Labor Progress Note   27 y.o. [id number] @ [redacted]w[redacted]d , admitted for  Pregnancy, Labor Management. IOL/TOLAC  Subjective:  Comfortable with epidural  Objective:  BP 109/80   Pulse 91   Temp 98.1 F (36.7 C) (Axillary)   Resp 18   Ht 5\' 7"  (1.702 m)   Wt 124.3 kg   LMP 12/11/2021 (Exact Date)   SpO2 99%   BMI 42.92 kg/m  Abd: gravid, ND, FHT present, mild tenderness on exam Extr: trace to 1+ bilateral pedal edema SVE: CERVIX: 9 cm dilated, 90 effaced, -2 station AROM copious clear fluid  EFM: FHR: 150 bpm, variability: moderate,  accelerations:  Present,  decelerations:  Absent Toco: Frequency: Every 2-3 minutes Labs: I have reviewed the patient's lab results.   Assessment & Plan:  [id number] @ [redacted]w[redacted]d, admitted for  Pregnancy and Labor/Delivery Management  1. Pain management: epidural. 2. FWB: FHT category I.  3. ID: GBS negative 4. Labor management: s/p AROM, continue pitocin titration  All discussed with patient, see orders   Tresea Mall, CNM [facility] 09/28/2022  1:40 AM

## 2022-09-28 NOTE — Interval H&P Note (Signed)
History and Physical Interval Note:  09/28/2022 8:50 AM  Andrea Chang  has presented today for surgery, with the diagnosis of Unscheduled Cesarean Section, See Delivery Summary.  The various methods of treatment have been discussed with the patient and family. After consideration of risks, benefits and other options for treatment, the patient has consented to  Procedure(s): CESAREAN SECTION as a surgical intervention.  The patient's history has been reviewed, patient examined, no change in status, stable for surgery.  I have reviewed the patient's chart and labs.  Questions were answered to the patient's satisfaction.    [name] with hx of prior cesarean section, now fully dilated without descent with good maternal effort, requesting repeat cesarean section. Fetal tachycardia with good variability and late decels since approx 7am, FHT rose to 200 bpm.Now with minimal variability and baseline 155. Concerning for fetal intolerance, proceed with cesarean section.  The risks of cesarean section discussed with the patient included but were not limited to: bleeding which may require transfusion or reoperation; infection which may require antibiotics; injury to bowel, bladder, ureters or other surrounding organs; injury to the fetus; need for additional procedures including hysterectomy in the event of a life-threatening hemorrhage; placental abnormalities wth subsequent pregnancies, incisional problems, thromboembolic phenomenon and other postoperative/anesthesia complications. The patient concurred with the proposed plan, giving informed written consent for the procedure.   Anesthesia and OR aware. Preoperative prophylactic antibiotics and SCDs ordered on call to the OR.  To OR when ready.     Andrea Chang

## 2022-09-28 NOTE — Transfer of Care (Signed)
Immediate Anesthesia Transfer of Care Note  Patient: Andrea Chang  Procedure(s) Performed: CESAREAN SECTION  Patient Location: PACU  Anesthesia Type:Spinal  Level of Consciousness: awake, alert , and oriented  Airway & Oxygen Therapy: Patient Spontanous Breathing  Post-op Assessment: Report given to RN and Post -op Vital signs reviewed and stable  Post vital signs: Reviewed and stable  Last Vitals:  Vitals Value Taken Time  BP 111/67 1100  Temp 98.1   Pulse 103   Resp 11   SpO2 100     Last Pain:  0/10       Complications: No notable events documented.

## 2022-09-29 LAB — CBC
HCT: 28.4 % — ABNORMAL LOW (ref 36.0–46.0)
Hemoglobin: 9.1 g/dL — ABNORMAL LOW (ref 12.0–15.0)
MCH: 26.9 pg (ref 26.0–34.0)
MCHC: 32 g/dL (ref 30.0–36.0)
MCV: 84 fL (ref 80.0–100.0)
Platelets: 162 10*3/uL (ref 150–400)
RBC: 3.38 MIL/uL — ABNORMAL LOW (ref 3.87–5.11)
RDW: 14.6 % (ref 11.5–15.5)
WBC: 12.8 10*3/uL — ABNORMAL HIGH (ref 4.0–10.5)
nRBC: 0 % (ref 0.0–0.2)

## 2022-09-29 NOTE — Anesthesia Post-op Follow-up Note (Signed)
  Anesthesia Pain Follow-up Note  Patient: Andrea Chang  Day #: 1  Date of Follow-up: 09/29/2022 Time: 8:58 AM  Last Vitals:  Vitals:   09/29/22 0500 09/29/22 0739  BP:  (!) 103/52  Pulse: 91 96  Resp:  18  Temp:  36.5 C  SpO2: 93% 98%    Level of Consciousness: alert  Pain: mild   Side Effects:None  Catheter Site Exam:clean, dry  Anti-Coag Meds (From admission, onward)    Start     Dose/Rate Route Frequency Ordered Stop   09/29/22 0800  enoxaparin (LOVENOX) injection 40 mg        40 mg Subcutaneous Every 24 hours 09/28/22 1151          Plan: D/C from anesthesia care at surgeon's request  Corinda Gubler

## 2022-09-29 NOTE — Progress Notes (Signed)
Subjective:   Andrea Chang had a c/s on 09/28/22 at 1005. Had failed TOLAC, was being induced for elevated BMI but had c/s for failure to progress/descend. Has had routine postpartum care.  Pt. Is eating, hydrating, and voiding regularly without difficulty. Has yet to have BM but does report flatulence. Has a good appetite currently. She is breastfeeding and bottle feeding per preference, plans to continue this when she goes home. Reports small amount of vaginal bleeding, denies passing large blood clots. Has had incisional pain relieved with tylenol, toradol and now ibuprofen. Denies anxiety/depression symptoms. Endorses good support from partner and family.   Objective:  Vital signs in last 24 hours: Temp:  [97.6 F (36.4 C)-98 F (36.7 C)] 97.7 F (36.5 C) (04/28 0739) Pulse Rate:  [75-103] 94 (04/28 0900) Resp:  [17-20] 18 (04/28 0739) BP: (103-126)/(52-68) 103/52 (04/28 0739) SpO2:  [93 %-100 %] 94 % (04/28 0900)    General: NAD Pulmonary: no increased work of breathing Breasts: soft, non-tender, nipples without breakdown Abdomen: soft, non-tender, honeycomb dressing on lower abd, incision looks well approximated without any drainage or surrounding edema/erythema Fundus: firm, midline, at umbilicus Lochia: light rubra, no clots Perineum: no erythema or foul odor discharge Extremities: no edema, no erythema, no tenderness  Results for orders placed or performed during the hospital encounter of 09/27/22 (from the past 72 hour(s))  CBC     Status: Abnormal   Collection Time: 09/27/22  6:05 AM  Result Value Ref Range   WBC 9.9 4.0 - 10.5 K/uL   RBC 4.28 3.87 - 5.11 MIL/uL   Hemoglobin 11.5 (L) 12.0 - 15.0 g/dL   HCT 16.1 (L) 09.6 - 04.5 %   MCV 83.6 80.0 - 100.0 fL   MCH 26.9 26.0 - 34.0 pg   MCHC 32.1 30.0 - 36.0 g/dL   RDW 40.9 81.1 - 91.4 %   Platelets 222 150 - 400 K/uL   nRBC 0.0 0.0 - 0.2 %    Comment: Performed at [facility], [address]  [place], [place] [address]  RPR     Status: None   Collection Time: 09/27/22  6:37 AM  Result Value Ref Range   RPR Ser Ql NON REACTIVE NON REACTIVE    Comment: Performed at [facility], [address], [place], [place] [address]  Type and screen     Status: None   Collection Time: 09/27/22  6:37 AM  Result Value Ref Range   ABO/RH(D) O POS    Antibody Screen NEG    Sample Expiration      09/30/2022,2359 Performed at [facility], [address] [place], [place] [address]   Urine Drug Screen, Qualitative [facility] only)     Status: Abnormal   Collection Time: 09/27/22  6:44 PM  Result Value Ref Range   Tricyclic, Ur Screen NONE DETECTED NONE DETECTED   Amphetamines, Ur Screen NONE DETECTED NONE DETECTED   MDMA (Ecstasy)Ur Screen NONE DETECTED NONE DETECTED   Cocaine Metabolite,Ur [place] NONE DETECTED NONE DETECTED   Opiate, Ur Screen NONE DETECTED NONE DETECTED   Phencyclidine (PCP) Ur S NONE DETECTED NONE DETECTED   Cannabinoid 50 Ng, Ur [place] POSITIVE (A) NONE DETECTED   Barbiturates, Ur Screen NONE DETECTED NONE DETECTED   Benzodiazepine, Ur Scrn NONE DETECTED NONE DETECTED   Methadone Scn, Ur NONE DETECTED NONE DETECTED    Comment: (NOTE) Tricyclics + metabolites, urine    Cutoff 1000 ng/mL Amphetamines + metabolites, urine  Cutoff 1000 ng/mL MDMA (Ecstasy), urine              Cutoff 500 ng/mL Cocaine Metabolite, urine          Cutoff 300 ng/mL Opiate + metabolites, urine        Cutoff 300 ng/mL Phencyclidine (PCP), urine         Cutoff 25 ng/mL Cannabinoid, urine                 Cutoff 50 ng/mL Barbiturates + metabolites, urine  Cutoff 200 ng/mL Benzodiazepine, urine              Cutoff 200 ng/mL Methadone, urine                   Cutoff 300 ng/mL  The urine drug screen provides only a preliminary, unconfirmed analytical test result and should not be used for non-medical purposes. Clinical consideration and professional judgment should be applied to any positive drug  screen result due to possible interfering substances. A more specific alternate chemical method must be used in order to obtain a confirmed analytical result. Gas chromatography / mass spectrometry (GC/MS) is the preferred confirm atory method. Performed at [facility], [address] [place], [place] [address]   Creatinine, serum     Status: None   Collection Time: 09/28/22  9:05 AM  Result Value Ref Range   Creatinine, Ser 0.72 0.44 - 1.00 mg/dL   GFR, Estimated >60 >45 mL/min    Comment: (NOTE) Calculated using the CKD-EPI Creatinine Equation (2021) Performed at [facility], [address] [place], [place] [address]   CBC     Status: Abnormal   Collection Time: 09/28/22  9:09 AM  Result Value Ref Range   WBC 11.7 (H) 4.0 - 10.5 K/uL   RBC 3.89 3.87 - 5.11 MIL/uL   Hemoglobin 10.4 (L) 12.0 - 15.0 g/dL   HCT 19.1 (L) 47.8 - 29.5 %   MCV 84.3 80.0 - 100.0 fL   MCH 26.7 26.0 - 34.0 pg   MCHC 31.7 30.0 - 36.0 g/dL   RDW 62.1 30.8 - 65.7 %   Platelets 187 150 - 400 K/uL   nRBC 0.0 0.0 - 0.2 %    Comment: Performed at [facility], [address] [place], [place] [address]  CBC     Status: Abnormal   Collection Time: 09/29/22  6:36 AM  Result Value Ref Range   WBC 12.8 (H) 4.0 - 10.5 K/uL   RBC 3.38 (L) 3.87 - 5.11 MIL/uL   Hemoglobin 9.1 (L) 12.0 - 15.0 g/dL   HCT 29.5 (L) 28.4 - 13.2 %   MCV 84.0 80.0 - 100.0 fL   MCH 26.9 26.0 - 34.0 pg   MCHC 32.0 30.0 - 36.0 g/dL   RDW 44.0 10.2 - 72.5 %   Platelets 162 150 - 400 K/uL   nRBC 0.0 0.0 - 0.2 %    Comment: Performed at [facility], [address] [place], [place] [address]    Assessment:   27 y.o. [phone] 1 day(s)  s/p c/s Breast and bottle feeding Anemia secondary to acute blood loss- hemodynamically stable and symptomatic VSS Pain well controlled  Plan:    PO Fe Blood Type --/--/O POS (04/26 9563) / Rubella 5.20 (10/03 1019) / Varicella Non-Immune Rhogam not  indicated Tdap and varicella to be offered before discharge Feeding plan breast & bottle, lactation support Encouraged to continue breastfeeding, BF education  on latch, position changes, cluster feeding, hunger cues, lactogenesis II, milk supply Education given regarding options for contraception, as well as compatibility with breast feeding if applicable.  Patient undecided on plans for contraception. Continued routine postpartum care  Counseled on normal uterine involution and vaginal bleeding postpartum Anticipate discharge home tomorrow or the following day    Raeford Razor, FNP, CNM [facility] 09/29/2022, 2:42 PM

## 2022-09-29 NOTE — Lactation Note (Signed)
This note was copied from a baby's chart. Lactation Consultation Note  Patient Name: Andrea Chang [id number] Date: 09/29/2022 Age:27 hours     Attempted to follow-up with mom today.   Assisted mom yesterday with breastfeeding. Baby observed breastfeeding well yesterday. Mom's choice is combination feeding: breastfeed and bottle feeding formula. LC entered the room and mom reports her nipples are sore and she hasn't put the baby to breast. Mom said she is using coconut oil. Mom verbalized she is trying to rest now. She declines additional LC assistance at this time. LC encouraged mom to call [facility] and /or care nurse for breastfeeding assistance. Mom has LC number on white board and did not call.Per care nurse mom is mostly bottle feeding today.    Fuller Song 09/29/2022, 6:23 PM

## 2022-09-29 NOTE — Anesthesia Postprocedure Evaluation (Signed)
Anesthesia Post Note  Patient: Andrea Chang  Procedure(s) Performed: CESAREAN SECTION  Patient location during evaluation: Mother Baby Anesthesia Type: Spinal and Epidural Level of consciousness: awake and alert Pain management: pain level controlled Vital Signs Assessment: post-procedure vital signs reviewed and stable Respiratory status: spontaneous breathing, nonlabored ventilation and respiratory function stable Cardiovascular status: stable Postop Assessment: no headache, no backache and epidural receding Anesthetic complications: no   No notable events documented.   Last Vitals:  Vitals:   09/29/22 0500 09/29/22 0739  BP:  (!) 103/52  Pulse: 91 96  Resp:  18  Temp:  36.5 C  SpO2: 93% 98%    Last Pain:  Vitals:   09/29/22 0739  TempSrc: Oral  PainSc:                  Corinda Gubler

## 2022-09-30 ENCOUNTER — Encounter: Payer: Self-pay | Admitting: Obstetrics and Gynecology

## 2022-09-30 MED ORDER — OXYCODONE HCL 5 MG PO TABS: 5.0000 mg | ORAL_TABLET | Freq: Four times a day (QID) | ORAL | 0 refills | Status: DC | PRN

## 2022-09-30 MED ORDER — IBUPROFEN 600 MG PO TABS: 600.0000 mg | ORAL_TABLET | Freq: Four times a day (QID) | ORAL | 0 refills | Status: DC

## 2022-09-30 NOTE — Discharge Instructions (Signed)
Discharge Instructions:  ° °If there are any new medications, they have been ordered and will be available for pickup at the listed pharmacy on your way home from the hospital.  ° °Call office if you have any of the following: headache, visual changes, fever >101.0 F, chills, shortness of breath, breast concerns, excessive vaginal bleeding, incision drainage or problems, leg pain or redness, depression or any other concerns. If you have vaginal discharge with an odor, let your doctor know.  ° °It is normal to bleed for up to 6 weeks. You should not soak through more than 1 pad in 1 hour. If you have a blood clot larger than your fist with continued bleeding, call your doctor.  ° °After a c-section, you should expect a small amount of blood or clear fluid coming from the incision and abdominal cramping/soreness. Inspect your incision site daily. Stand in front of a mirror to look for any redness, incision opening, or discolored/odorness drainage. Take a shower daily and continue good hygiene. Use own towel and washcloth (do not share). Make sure your sheets on your bed are clean. No pets sleeping around your incision site. Dressing will be removed at your postpartum visit. If the dressing does become wet or soiled underneath, it is okay to remove it.  ° °On-Q pump: You will remove on day 5 after insertion or if the ball becomes flat before day 5. You will remove on: Oct 04, 2018 ° °Activity: Do not lift > 10 lbs for 6 weeks (do not lift anything heavier than your baby). °No intercourse, tampons, swimming pools, hot tubs, baths (only showers) for 6 weeks.  °No driving for 1-2 weeks. °Continue prenatal vitamin, especially if breastfeeding. °Increase calories and fluids (water) while breastfeeding.  ° °Your milk will come in, in the next couple of days (right now it is colostrum). You may have a slight fever when your milk comes in, but it should go away on its own.  If it does not, and rises above 101 F please call the  doctor. You will also feel achy and your breasts will be firm. They will also start to leak. If you are breastfeeding, continue as you have been and you can pump/express milk for comfort.  ° °If you have too much milk, your breasts can become engorged, which could lead to mastitis. This is an infection of the milk ducts. It can be very painful and you will need to notify your doctor to obtain a prescription for antibiotics. You can also treat it with a shower or hot/cold compress.  ° °For concerns about your baby, please call your pediatrician.  °For breastfeeding concerns, the lactation consultant can be reached at [phone].  ° °Postpartum blues (feelings of happy one minute and sad another minute) are normal for the first few weeks but if it gets worse let your doctor know.  ° °Congratulations! We enjoyed caring for you and your new bundle of joy!  °

## 2022-09-30 NOTE — Progress Notes (Signed)
Patient discharged home with infant. Discharge instructions and prescriptions given and reviewed with patient. Patient verbalized understanding. Escorted out by auxillary.  

## 2022-09-30 NOTE — Discharge Summary (Signed)
Postpartum Discharge Summary  Date of Service updated 09/30/2022     Patient Name: Andrea Chang DOB: 01-09-1996 MRN: [id number]  Date of admission: 09/27/2022 Delivery date:09/28/2022  Delivering provider: Christeen Douglas  Date of discharge: 09/30/2022  Admitting diagnosis: Encounter for induction of labor [Z34.90] Intrauterine pregnancy: [redacted]w[redacted]d     Secondary diagnosis:  Active Problems:   Postpartum care following cesarean delivery  Additional problems: none    Discharge diagnosis: Term Pregnancy Delivered                                              Post partum procedures: none Augmentation: AROM, Pitocin, and IP Foley Complications: None  Hospital course: Induction of Labor With Cesarean Section   27 y.o. yo [id number] at [redacted]w[redacted]d was admitted to the hospital 09/27/2022 for induction of labor. Patient had a labor course significant for failed TOLAC. The patient went for cesarean section due to Arrest of Dilation, Arrest of Descent, and Non-Reassuring FHR. Delivery details are as follows: Membrane Rupture Time/Date: 1:24 AM ,09/28/2022   Delivery Method:C-Section, Low Transverse  Details of operation can be found in separate operative Note.  Patient had a postpartum course complicated bynothing. She is ambulating, tolerating a regular diet, passing flatus, and urinating well.  Patient is discharged home in stable condition on 09/30/22.      Newborn Data: Birth date:09/28/2022  Birth time:10:05 AM  Gender:Female  Living status:Living  Apgars:9 ,9  Weight:3970 g                                Magnesium Sulfate received: No BMZ received: No Rhophylac:N/A MMR:No T-DaP: declined Flu: No Transfusion:No  Physical exam  Vitals:   09/29/22 1620 09/29/22 2329 09/30/22 0700 09/30/22 1545  BP: 120/67 125/65 113/74 108/70  Pulse: 91 92 82 82  Resp: 18 20 18 20   Temp: 98.5 F (36.9 C) 98.9 F (37.2 C) 98.2 F (36.8 C) 98.5 F (36.9 C)  TempSrc: Oral Oral Oral Oral  SpO2: 99%  100% 93% 95%  Weight:      Height:       General: alert, cooperative, and no distress Lochia: appropriate Uterine Fundus: firm Incision: Healing well with no significant drainage, No significant erythema, Dressing is clean, dry, and intact DVT Evaluation: No evidence of DVT seen on physical exam. Negative Homan's sign. No cords or calf tenderness. Labs: Lab Results  Component Value Date   WBC 12.8 (H) 09/29/2022   HGB 9.1 (L) 09/29/2022   HCT 28.4 (L) 09/29/2022   MCV 84.0 09/29/2022   PLT 162 09/29/2022      Latest Ref Rng & Units 09/28/2022    9:05 AM  CMP  Creatinine 0.44 - 1.00 mg/dL 1.47    Edinburgh Score:    09/29/2022    4:20 PM  Edinburgh Postnatal Depression Scale Screening Tool  I have been able to laugh and see the funny side of things. 0  I have looked forward with enjoyment to things. 0  I have blamed myself unnecessarily when things went wrong. 1  I have been anxious or worried for no good reason. 1  I have felt scared or panicky for no good reason. 1  Things have been getting on top of me. 1  I have been so unhappy that  I have had difficulty sleeping. 1  I have felt sad or miserable. 1  I have been so unhappy that I have been crying. 1  The thought of harming myself has occurred to me. 0  Edinburgh Postnatal Depression Scale Total 7      After visit meds:  Allergies as of 09/30/2022   No Known Allergies      Medication List     TAKE these medications    fluticasone 50 MCG/ACT nasal spray Commonly known as: FLONASE Place 2 sprays into both nostrils daily.   hydrocortisone 2.5 % cream Apply topically 3 (three) times daily as needed for up to 7 days.   ibuprofen 600 MG tablet Commonly known as: ADVIL Take 1 tablet (600 mg total) by mouth every 6 (six) hours.   loratadine 10 MG tablet Commonly known as: Claritin Take 1 tablet (10 mg total) by mouth daily.   oxyCODONE 5 MG immediate release tablet Commonly known as: Oxy  IR/ROXICODONE Take 1 tablet (5 mg total) by mouth every 6 (six) hours as needed for severe pain.   PRENATAL GUMMIES PO Take by mouth.               Discharge Care Instructions  (From admission, onward)           Start     Ordered   09/30/22 0000  Leave dressing on - Keep it clean, dry, and intact until clinic visit        09/30/22 1627             Discharge home in stable condition Infant Feeding: Bottle Infant Disposition:home with mother Discharge instruction: per After Visit Summary and Postpartum booklet. Activity: Advance as tolerated. Pelvic rest for 6 weeks.  Diet: routine diet Anticipated Birth Control: IUD Postpartum Appointment:1 week and 6 weeks Additional Postpartum F/U: Incision check 1 week Future Appointments:No future appointments. Follow up Visit:  Follow-up Information     [facility] Follow up in 1 week(s).   Specialty: Obstetrics and Gynecology Why: Call the AOB office and make an appt for an incision check with one of the doctors. make another appointment for a post delivery physical and birth control visit in 6 weeks. Contact information: [address] [place] [address] [phone]                    09/30/2022 Mirna Mires, CNM

## 2022-10-08 ENCOUNTER — Ambulatory Visit (INDEPENDENT_AMBULATORY_CARE_PROVIDER_SITE_OTHER): Payer: Medicaid Other | Admitting: Obstetrics and Gynecology

## 2022-10-08 ENCOUNTER — Encounter: Payer: Self-pay | Admitting: Obstetrics and Gynecology

## 2022-10-08 VITALS — BP 122/85 | HR 103 | Ht 67.0 in | Wt 260.8 lb

## 2022-10-08 DIAGNOSIS — Z9889 Other specified postprocedural states: Secondary | ICD-10-CM

## 2022-10-08 NOTE — Progress Notes (Signed)
Patient presents today for 1 week postpartum follow-up. Patient had a cesarean delivery on 09/28/22.  She is bottle feeding mainly, states wanting to breast feed after finishing her oxycodone. She states she is unsure of what she would like for birth control. EPDS score of 4.
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
## 2022-11-12 ENCOUNTER — Ambulatory Visit (INDEPENDENT_AMBULATORY_CARE_PROVIDER_SITE_OTHER): Payer: Medicaid Other | Admitting: Obstetrics and Gynecology

## 2022-11-12 ENCOUNTER — Encounter: Payer: Self-pay | Admitting: Obstetrics and Gynecology

## 2022-11-12 VITALS — BP 120/79 | HR 93 | Ht 67.0 in | Wt 260.1 lb

## 2022-11-12 DIAGNOSIS — Z9889 Other specified postprocedural states: Secondary | ICD-10-CM

## 2022-11-12 MED ORDER — DESOGESTREL-ETHINYL ESTRADIOL 0.15-0.02/0.01 MG (21/5) PO TABS
1.0000 | ORAL_TABLET | Freq: Every day | ORAL | 1 refills | Status: AC
Start: 2022-11-12 — End: ?

## 2022-11-12 NOTE — Progress Notes (Signed)
Patient presents today for 6 week postpartum follow-up. Patient had a cesarean delivery on 09/28/22. She is bottle feeding. She states she would like OCP's for birth control, history of OCP use 10 years ago. EPDS score of 1. States slight pain around incision occasionally, denies redness and warmth.
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
## 2023-08-20 ENCOUNTER — Emergency Department
Admission: EM | Admit: 2023-08-20 | Discharge: 2023-08-20 | Disposition: A | Discharge status: Home / Self Care | Attending: Emergency Medicine | Admitting: Emergency Medicine

## 2023-08-20 ENCOUNTER — Other Ambulatory Visit: Payer: Self-pay

## 2023-08-20 ENCOUNTER — Emergency Department

## 2023-08-20 DIAGNOSIS — M7981 Nontraumatic hematoma of soft tissue: Secondary | ICD-10-CM | POA: Insufficient documentation

## 2023-08-20 DIAGNOSIS — R059 Cough, unspecified: Secondary | ICD-10-CM | POA: Diagnosis present

## 2023-08-20 DIAGNOSIS — T148XXA Other injury of unspecified body region, initial encounter: Secondary | ICD-10-CM

## 2023-08-20 DIAGNOSIS — R519 Headache, unspecified: Secondary | ICD-10-CM

## 2023-08-20 DIAGNOSIS — J101 Influenza due to other identified influenza virus with other respiratory manifestations: Secondary | ICD-10-CM | POA: Diagnosis not present

## 2023-08-20 LAB — CBC WITH DIFFERENTIAL/PLATELET
Abs Immature Granulocytes: 0.02 10*3/uL (ref 0.00–0.07)
Basophils Absolute: 0 10*3/uL (ref 0.0–0.1)
Basophils Relative: 0 %
Eosinophils Absolute: 0.1 10*3/uL (ref 0.0–0.5)
Eosinophils Relative: 1 %
HCT: 38.4 % (ref 36.0–46.0)
Hemoglobin: 11.7 g/dL — ABNORMAL LOW (ref 12.0–15.0)
Immature Granulocytes: 0 %
Lymphocytes Relative: 10 %
Lymphs Abs: 0.7 10*3/uL (ref 0.7–4.0)
MCH: 26.7 pg (ref 26.0–34.0)
MCHC: 30.5 g/dL (ref 30.0–36.0)
MCV: 87.5 fL (ref 80.0–100.0)
Monocytes Absolute: 0.6 10*3/uL (ref 0.1–1.0)
Monocytes Relative: 8 %
Neutro Abs: 5.8 10*3/uL (ref 1.7–7.7)
Neutrophils Relative %: 81 %
Platelets: 236 10*3/uL (ref 150–400)
RBC: 4.39 MIL/uL (ref 3.87–5.11)
RDW: 16.1 % — ABNORMAL HIGH (ref 11.5–15.5)
WBC: 7.2 10*3/uL (ref 4.0–10.5)
nRBC: 0 % (ref 0.0–0.2)

## 2023-08-20 LAB — RESP PANEL BY RT-PCR (RSV, FLU A&B, COVID)  RVPGX2
Influenza A by PCR: POSITIVE — AB
Influenza B by PCR: NEGATIVE
Resp Syncytial Virus by PCR: NEGATIVE
SARS Coronavirus 2 by RT PCR: NEGATIVE

## 2023-08-20 LAB — RETICULOCYTES
Immature Retic Fract: 12.4 % (ref 2.3–15.9)
RBC.: 4.35 MIL/uL (ref 3.87–5.11)
Retic Count, Absolute: 63.5 10*3/uL (ref 19.0–186.0)
Retic Ct Pct: 1.5 % (ref 0.4–3.1)

## 2023-08-20 LAB — POC URINE PREG, ED: Preg Test, Ur: NEGATIVE

## 2023-08-20 MED ORDER — METHYLPREDNISOLONE 4 MG PO TBPK: ORAL_TABLET | ORAL | 0 refills | Status: AC

## 2023-08-20 MED ORDER — PSEUDOEPH-BROMPHEN-DM 30-2-10 MG/5ML PO SYRP: 5.0000 mL | ORAL_SOLUTION | Freq: Four times a day (QID) | ORAL | 0 refills | Status: AC | PRN

## 2023-08-20 MED ORDER — OSELTAMIVIR PHOSPHATE 75 MG PO CAPS: 75.0000 mg | ORAL_CAPSULE | Freq: Two times a day (BID) | ORAL | 0 refills | Status: AC

## 2023-08-20 NOTE — ED Triage Notes (Signed)
 First nurse note: Pt here from [facility] clinic with new onset of headache and bilateral eye changes. [facility] clinic states new easily bruised area, no thinner use.

## 2023-08-20 NOTE — Discharge Instructions (Signed)
 I am doing a blood test that will assess your platelet count due to the bruising, if it indicates a disease called ITP I will call you and have you follow up with hematology Your flu test was positive for influenza a Take the medication as prescribed Return if worsening

## 2023-08-20 NOTE — ED Triage Notes (Signed)
 Pt here for HA and state she was at [facility] for flu like s/s. NAD noted. Pt denies head injury.

## 2023-08-20 NOTE — ED Provider Notes (Signed)
 [facility] Provider Note    Event Date/Time   First MD Initiated Contact with Patient 08/20/23 1641     (approximate)   History   Headache   HPI  Andrea Chang is a 28 y.o. female with history of iron deficiency anemia presents emergency department with bruising on the leg, headache, flulike symptoms, including cough, congestion and fever.  Patient used some medication her mother gave her but is unsure what it was.  Did not really help.  Denies chest pain/shortness of breath.  No bleeding from the gums etc.      Physical Exam   Triage Vital Signs: ED Triage Vitals  Encounter Vitals Group     BP 08/20/23 1353 117/77     Systolic BP Percentile --      Diastolic BP Percentile --      Pulse Rate 08/20/23 1353 98     Resp 08/20/23 1353 18     Temp 08/20/23 1353 98.8 F (37.1 C)     Temp Source 08/20/23 1353 Oral     SpO2 08/20/23 1353 100 %     Weight 08/20/23 1354 295 lb (133.8 kg)     Height 08/20/23 1354 5\' 6"  (1.676 m)     Head Circumference --      Peak Flow --      Pain Score 08/20/23 1409 7     Pain Loc --      Pain Education --      Exclude from Growth Chart --     Most recent vital signs: Vitals:   08/20/23 1353 08/20/23 1711  BP: 117/77 114/88  Pulse: 98 92  Resp: 18 15  Temp: 98.8 F (37.1 C) 98.7 F (37.1 C)  SpO2: 100% 100%     General: Awake, no distress.   CV:  Good peripheral perfusion. regular rate and  rhythm Resp:  Normal effort. Lungs CTA Abd:  No distention.   Other:      ED Results / Procedures / Treatments   Labs (all labs ordered are listed, but only abnormal results are displayed) Labs Reviewed  RESP PANEL BY RT-PCR (RSV, FLU A&B, COVID)  RVPGX2 - Abnormal; Notable for the following components:      Result Value   Influenza A by PCR POSITIVE (*)    All other components within normal limits  CBC WITH DIFFERENTIAL/PLATELET - Abnormal; Notable for the following components:   Hemoglobin 11.7 (*)     RDW 16.1 (*)    All other components within normal limits  RETICULOCYTES  POC URINE PREG, ED     EKG     RADIOLOGY CT of the head    PROCEDURES:   Procedures Chief Complaint  Patient presents with   Headache      MEDICATIONS ORDERED IN ED: Medications - No data to display   IMPRESSION / MDM / ASSESSMENT AND PLAN / ED COURSE  I reviewed the triage vital signs and the nursing notes.                              Differential diagnosis includes, but is not limited to, subdural, SAH, CVA, COVID, influenza, RSV, acute sinusitis, ITP, contusion/bruising  Patient's presentation is most consistent with acute illness / injury with system symptoms.   Respiratory panel is positive for influenza A POC pregnancy negative  Will do a CBC and retake count, if positive will call the patient  to be evaluated for ITP, in the meantime I did start her on a Medrol Dosepak which would be the treatment for ITP and would also help with her cough.  It may actually help with the headache 2.  She is to follow-up with her regular doctor.  Return emergency department worsening.  She is given prescription for Tamiflu as she is less than 24 hours sick.  Also given a prescription for Bromfed cough syrup.  If she has more bruising or starts bleeding from the gums she is to return emergency department immediately.  Patient is in agreement with this treatment plan.  She was discharged stable condition.   CBC reassuring, patient does not have ITP   FINAL CLINICAL IMPRESSION(S) / ED DIAGNOSES   Final diagnoses:  Influenza A  Bad headache  Bruising     Rx / DC Orders   ED Discharge Orders          Ordered    oseltamivir (TAMIFLU) 75 MG capsule  2 times daily        08/20/23 1657    methylPREDNISolone (MEDROL DOSEPAK) 4 MG TBPK tablet        08/20/23 1657    brompheniramine-pseudoephedrine-DM 30-2-10 MG/5ML syrup  4 times daily PRN        08/20/23 1657             Note:  This  document was prepared using Dragon voice recognition software and may include unintentional dictation errors.    Faythe Ghee, PA-C 08/20/23 Josephina Gip, MD 08/20/23 [phone]

## 2023-12-21 ENCOUNTER — Other Ambulatory Visit: Payer: Self-pay

## 2023-12-21 ENCOUNTER — Emergency Department
Admission: EM | Admit: 2023-12-21 | Discharge: 2023-12-21 | Disposition: A | Discharge status: Home / Self Care | Attending: Emergency Medicine | Admitting: Emergency Medicine

## 2023-12-21 DIAGNOSIS — R519 Headache, unspecified: Secondary | ICD-10-CM | POA: Insufficient documentation

## 2023-12-21 DIAGNOSIS — Y92810 Car as the place of occurrence of the external cause: Secondary | ICD-10-CM | POA: Insufficient documentation

## 2023-12-21 DIAGNOSIS — S81819A Laceration without foreign body, unspecified lower leg, initial encounter: Secondary | ICD-10-CM

## 2023-12-21 DIAGNOSIS — S71111A Laceration without foreign body, right thigh, initial encounter: Secondary | ICD-10-CM | POA: Diagnosis present

## 2023-12-21 MED ORDER — BACITRACIN ZINC 500 UNIT/GM EX OINT: TOPICAL_OINTMENT | CUTANEOUS | Status: DC

## 2023-12-21 NOTE — ED Notes (Signed)
 Small shards of glass noted on pt bilateral arms and legs.
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
## 2023-12-21 NOTE — ED Notes (Signed)
Pt not in room upon assessment.  

## 2023-12-21 NOTE — Discharge Instructions (Signed)
 We recommend that you take a thorough shower when you get home to wash off any remaining glass particles.  You can use soap and water on the small lacerations that are on your thigh and any other small lacerations you may discover.  We recommend you apply a thin layer of bacitracin  or other antibiotic ointment and cover it up with a Band-Aid or gauze.  You can repeat this process twice a day until the wounds are completely healed.  You can follow-up with your primary care doctor as needed.
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
## 2024-03-04 ENCOUNTER — Ambulatory Visit (LOCAL_COMMUNITY_HEALTH_CENTER)

## 2024-03-04 DIAGNOSIS — Z3202 Encounter for pregnancy test, result negative: Secondary | ICD-10-CM

## 2024-03-04 LAB — PREGNANCY, URINE: Preg Test, Ur: NEGATIVE

## 2024-03-04 NOTE — Progress Notes (Signed)
 Pt came to the door at nurse clinic voiced she had looked at the result and did not want to stay to speak with nurse to complete visit. Was able to ask few questions she stated had missed a period last month and had her other baby ago. Stated she was not on BCM. She stated she has PCP and will follow up with them.
